# Patient Record
Sex: Female | Born: 1935 | Race: White | Hispanic: No | Smoking: Never smoker
Health system: Southern US, Community
[De-identification: ages and names within clinical notes are randomized; demographics above are authoritative.]

## PROBLEM LIST (undated history)

## (undated) DIAGNOSIS — M4850XA Collapsed vertebra, not elsewhere classified, site unspecified, initial encounter for fracture: Secondary | ICD-10-CM

## (undated) DIAGNOSIS — G8929 Other chronic pain: Secondary | ICD-10-CM

## (undated) DIAGNOSIS — I10 Essential (primary) hypertension: Secondary | ICD-10-CM

## (undated) DIAGNOSIS — M549 Dorsalgia, unspecified: Secondary | ICD-10-CM

## (undated) DIAGNOSIS — I4891 Unspecified atrial fibrillation: Secondary | ICD-10-CM

## (undated) DIAGNOSIS — I341 Nonrheumatic mitral (valve) prolapse: Secondary | ICD-10-CM

## (undated) DIAGNOSIS — I2510 Atherosclerotic heart disease of native coronary artery without angina pectoris: Secondary | ICD-10-CM

## (undated) DIAGNOSIS — E871 Hypo-osmolality and hyponatremia: Secondary | ICD-10-CM

## (undated) HISTORY — DX: Dorsalgia, unspecified: M54.9

## (undated) HISTORY — DX: Collapsed vertebra, not elsewhere classified, site unspecified, initial encounter for fracture: M48.50XA

## (undated) HISTORY — DX: Other chronic pain: G89.29

## (undated) HISTORY — DX: Hypo-osmolality and hyponatremia: E87.1

## (undated) HISTORY — DX: Unspecified atrial fibrillation: I48.91

---

## 2001-05-14 ENCOUNTER — Emergency Department (HOSPITAL_COMMUNITY): Admission: EM | Admit: 2001-05-14 | Discharge: 2001-05-14 | Payer: Self-pay

## 2001-05-14 ENCOUNTER — Encounter: Payer: Self-pay | Admitting: Emergency Medicine

## 2003-11-11 ENCOUNTER — Emergency Department (HOSPITAL_COMMUNITY): Admission: EM | Admit: 2003-11-11 | Discharge: 2003-11-11 | Payer: Self-pay | Admitting: *Deleted

## 2010-11-02 ENCOUNTER — Inpatient Hospital Stay (INDEPENDENT_AMBULATORY_CARE_PROVIDER_SITE_OTHER)
Admission: RE | Admit: 2010-11-02 | Discharge: 2010-11-02 | Disposition: A | Discharge status: Home / Self Care | Payer: Medicare Other | Source: Ambulatory Visit | Attending: Emergency Medicine | Admitting: Emergency Medicine

## 2010-11-02 ENCOUNTER — Ambulatory Visit (INDEPENDENT_AMBULATORY_CARE_PROVIDER_SITE_OTHER): Payer: Medicare Other

## 2010-11-02 ENCOUNTER — Inpatient Hospital Stay (HOSPITAL_COMMUNITY)
Admission: EM | Admit: 2010-11-02 | Discharge: 2010-11-05 | DRG: 690 | Disposition: A | Discharge status: Home / Self Care | Payer: Medicare Other | Source: Ambulatory Visit | Attending: Internal Medicine | Admitting: Internal Medicine

## 2010-11-02 DIAGNOSIS — T502X5A Adverse effect of carbonic-anhydrase inhibitors, benzothiadiazides and other diuretics, initial encounter: Secondary | ICD-10-CM | POA: Diagnosis present

## 2010-11-02 DIAGNOSIS — N390 Urinary tract infection, site not specified: Principal | ICD-10-CM | POA: Diagnosis present

## 2010-11-02 DIAGNOSIS — I4891 Unspecified atrial fibrillation: Secondary | ICD-10-CM | POA: Diagnosis present

## 2010-11-02 DIAGNOSIS — E876 Hypokalemia: Secondary | ICD-10-CM | POA: Diagnosis present

## 2010-11-02 DIAGNOSIS — E871 Hypo-osmolality and hyponatremia: Secondary | ICD-10-CM | POA: Diagnosis present

## 2010-11-02 DIAGNOSIS — I10 Essential (primary) hypertension: Secondary | ICD-10-CM | POA: Diagnosis present

## 2010-11-02 DIAGNOSIS — Y92009 Unspecified place in unspecified non-institutional (private) residence as the place of occurrence of the external cause: Secondary | ICD-10-CM

## 2010-11-02 DIAGNOSIS — I959 Hypotension, unspecified: Secondary | ICD-10-CM | POA: Diagnosis present

## 2010-11-02 DIAGNOSIS — Z888 Allergy status to other drugs, medicaments and biological substances status: Secondary | ICD-10-CM

## 2010-11-02 DIAGNOSIS — I252 Old myocardial infarction: Secondary | ICD-10-CM

## 2010-11-02 DIAGNOSIS — R05 Cough: Secondary | ICD-10-CM

## 2010-11-02 DIAGNOSIS — Z7982 Long term (current) use of aspirin: Secondary | ICD-10-CM

## 2010-11-02 DIAGNOSIS — R9431 Abnormal electrocardiogram [ECG] [EKG]: Secondary | ICD-10-CM | POA: Diagnosis present

## 2010-11-02 DIAGNOSIS — Z79899 Other long term (current) drug therapy: Secondary | ICD-10-CM

## 2010-11-02 DIAGNOSIS — I2510 Atherosclerotic heart disease of native coronary artery without angina pectoris: Secondary | ICD-10-CM | POA: Diagnosis present

## 2010-11-03 DIAGNOSIS — I359 Nonrheumatic aortic valve disorder, unspecified: Secondary | ICD-10-CM

## 2010-11-03 DIAGNOSIS — R9431 Abnormal electrocardiogram [ECG] [EKG]: Secondary | ICD-10-CM

## 2010-11-03 LAB — BASIC METABOLIC PANEL
BUN: 29 mg/dL — ABNORMAL HIGH (ref 6–23)
BUN: 25 mg/dL — ABNORMAL HIGH (ref 6–23)
CO2: 29 mEq/L (ref 19–32)
Calcium: 8.9 mg/dL (ref 8.4–10.5)
Calcium: 8.9 mg/dL (ref 8.4–10.5)
Calcium: 8.4 mg/dL (ref 8.4–10.5)
Chloride: 85 mEq/L — ABNORMAL LOW (ref 96–112)
Creatinine, Ser: 0.86 mg/dL (ref 0.50–1.10)
GFR calc Af Amer: >60 mL/min (ref >60)
GFR calc Af Amer: >60 mL/min (ref >60)
GFR calc non Af Amer: >60 mL/min (ref >60)
GFR calc non Af Amer: >60 mL/min (ref >60)
GFR calc non Af Amer: >60 mL/min (ref >60)
Glucose, Bld: 121 mg/dL — ABNORMAL HIGH (ref 70–99)
Glucose, Bld: 129 mg/dL — ABNORMAL HIGH (ref 70–99)
Glucose, Bld: 142 mg/dL — ABNORMAL HIGH (ref 70–99)
Potassium: 3.3 mEq/L — ABNORMAL LOW (ref 3.5–5.1)
Sodium: 123 mEq/L — ABNORMAL LOW (ref 135–145)
Sodium: 125 mEq/L — ABNORMAL LOW (ref 135–145)
Sodium: 126 mEq/L — ABNORMAL LOW (ref 135–145)

## 2010-11-03 LAB — CBC
HCT: 37.9 % (ref 36.0–46.0)
Hemoglobin: 13.9 g/dL (ref 12.0–15.0)
Hemoglobin: 13.3 g/dL (ref 12.0–15.0)
MCH: 31.5 pg (ref 26.0–34.0)
MCH: 30.6 pg (ref 26.0–34.0)
MCHC: 36.7 g/dL — ABNORMAL HIGH (ref 30.0–36.0)
MCHC: 35.5 g/dL (ref 30.0–36.0)
MCV: 85.9 fL (ref 78.0–100.0)
Platelets: 201 10*3/uL (ref 150–400)
Platelets: 194 10*3/uL (ref 150–400)
RBC: 4.41 MIL/uL (ref 3.87–5.11)
RDW: 12.4 % (ref 11.5–15.5)
RDW: 12.4 % (ref 11.5–15.5)
WBC: 7.4 10*3/uL (ref 4.0–10.5)

## 2010-11-03 LAB — URINALYSIS, ROUTINE W REFLEX MICROSCOPIC
Glucose, UA: NEGATIVE mg/dL
Specific Gravity, Urine: 1.017 (ref 1.005–1.030)
pH: 5.5 (ref 5.0–8.0)

## 2010-11-03 LAB — CK TOTAL AND CKMB (NOT AT ARMC)
CK, MB: 4.1 ng/mL — ABNORMAL HIGH (ref 0.3–4.0)
Relative Index: INVALID (ref 0.0–2.5)
Total CK: 82 U/L (ref 7–177)

## 2010-11-03 LAB — PROTIME-INR: INR: 1.17 (ref 0.00–1.49)

## 2010-11-03 LAB — CARDIAC PANEL(CRET KIN+CKTOT+MB+TROPI)
CK, MB: 4.3 ng/mL — ABNORMAL HIGH (ref 0.3–4.0)
CK, MB: 4.5 ng/mL — ABNORMAL HIGH (ref 0.3–4.0)
Total CK: 88 U/L (ref 7–177)

## 2010-11-03 LAB — DIFFERENTIAL
Basophils Absolute: 0 10*3/uL (ref 0.0–0.1)
Basophils Relative: 0 % (ref 0–1)
Eosinophils Absolute: 0.3 10*3/uL (ref 0.0–0.7)
Eosinophils Relative: 4 % (ref 0–5)
Lymphocytes Relative: 31 % (ref 12–46)
Lymphs Abs: 2.3 10*3/uL (ref 0.7–4.0)
Monocytes Absolute: 0.9 10*3/uL (ref 0.1–1.0)
Monocytes Relative: 13 % — ABNORMAL HIGH (ref 3–12)
Neutro Abs: 3.9 10*3/uL (ref 1.7–7.7)
Neutrophils Relative %: 52 % (ref 43–77)

## 2010-11-03 LAB — URINE MICROSCOPIC-ADD ON

## 2010-11-03 LAB — APTT: aPTT: 36 seconds (ref 24–37)

## 2010-11-04 LAB — URINE CULTURE

## 2010-11-04 LAB — CBC
MCH: 30.7 pg (ref 26.0–34.0)
MCV: 87 fL (ref 78.0–100.0)
Platelets: 195 10*3/uL (ref 150–400)
RDW: 12.6 % (ref 11.5–15.5)

## 2010-11-04 LAB — BASIC METABOLIC PANEL
Calcium: 8.7 mg/dL (ref 8.4–10.5)
Creatinine, Ser: 0.7 mg/dL (ref 0.50–1.10)
GFR calc Af Amer: >60 mL/min (ref >60)

## 2010-11-05 LAB — BASIC METABOLIC PANEL
CO2: 30 mEq/L (ref 19–32)
Calcium: 9 mg/dL (ref 8.4–10.5)
Creatinine, Ser: 0.82 mg/dL (ref 0.50–1.10)
Glucose, Bld: 84 mg/dL (ref 70–99)
Sodium: 135 mEq/L (ref 135–145)

## 2010-11-05 LAB — CBC
Hemoglobin: 12.4 g/dL (ref 12.0–15.0)
MCH: 30.8 pg (ref 26.0–34.0)
MCV: 88.3 fL (ref 78.0–100.0)
RBC: 4.03 MIL/uL (ref 3.87–5.11)

## 2010-11-06 NOTE — H&P (Signed)
NAMEGRACY, [name]               ACCOUNT NO.:  [id number]  MEDICAL RECORD NO.:  [id number]  LOCATION:  [facility]                         FACILITY:  [facility]  PHYSICIAN:  Osvaldo Shipper, MD     DATE OF BIRTH:  23-Mar-1917  DATE OF ADMISSION:  11/03/2010 DATE OF DISCHARGE:                             HISTORY & PHYSICAL   PRIMARY CARE PHYSICIAN:  Dr. Lilyan Punt, MD, [place], [place].  ADMISSION DIAGNOSES: 1. Urinary tract infection. 2. Severe hyponatremia. 3. Hypokalemia. 4. Mild dehydration. 5. Abnormal EKG. 6. History of atrial fibrillation in the past. 7. History of hypertension.  CHIEF COMPLAINT:  Low blood pressure.  HISTORY OF PRESENT ILLNESS:  The patient is a 77 year old Caucasian female who has a past medical history of mitral valve prolapse, remote history of AFib, not on anticoagulation, history of coronary artery disease, hypertension, who was in her usual state of health until earlier this week when she started having cold symptoms and she was started to have sinus infection, and she had some dysuria.  She called her doctor and she was prescribed amoxicillin.  She took these medications, did not feel much better, and she felt that her heart was racing at times and was slow at times.  She did not mention this to anybody earlier this week but then her son went and checked on her and he found that her blood pressure was fluctuating quite a bit going from 190 systolic to 80 systolic, heart rate was in the 140.  He called his sister, the patient daughter who went to spot and decided to get her here for evaluation.  Initially took the patient to Urgent Care but then brought her down to the emergency department because of the weight.  The patient denies any chest pain.  She admitted to some nausea, but no warm a day or diarrhea.  No fever.  No chills.  She had a normal bowel movement yesterday.  The patient received some Benadryl just a few minutes before I saw her and so  she is confused at this time.  She got the Benadryl because she had a local reaction to the ciprofloxacin infusion.  So history is somewhat limited because of the patient's inability to answer my questions currently.  Most of the history was provided by the patient's daughter.  MEDICATIONS AT HOME:  Unfortunately, the patient's daughter left the list of her home medicines at the [facility]gent [facility] Center where she thinks the patient is on the following amoxicillin recently prescribed aspirin, chlorthalidone, isosorbide dinitrate, magnesium oxide, Mucinex, and sotalol  ALLERGIES:  ATENOLOL, DOXYCYCLINE, LEXAPRO, PROPRANOLOL and now will include CIPROFLOXACIN.  PAST MEDICAL HISTORY:  Positive for history AFib in the past, but not on anticoagulation, she is just on aspirin.  History of hypertension.  She had an MI in 22 which was thought to be mild, did not require any cardiac catheterization or angioplasty, history of mitral valve prolapse, history of hypertension.  Denies any surgeries at all whatsoever.  SOCIAL HISTORY:  She lives in [place].  She lives by herself.  She usually is very independent.  She cleans and cokes herself.  No smoking, alcohol, or illicit drug use.  There  is no living will according to the daughter.  FAMILY HISTORY:  Positive for heart disease.  REVIEW OF SYSTEMS:  Unable to do at this time because of the patient's confusion from the Benadryl.  PHYSICAL EXAMINATION:  VITAL SIGNS:  Temperature 98.4, blood pressure here has been fluctuating to some extent, the lowest has been 98/48, the last recorded as 144/76, heart rate in the 70s, respiratory rate 18, and saturation is 96% on room air. GENERAL:  Exam.  Elderly female thin and no distress. HEENT:  Head is normocephalic and atraumatic.  Pupils are equal and reacting.  No pallor.  No icterus.  Oral mucous membranes slightly dry. No oral lesions are noted. NECK:  Soft and supple.  No thyromegaly appreciated.  No  cervical, supraclavicular, or inguinal lymphadenopathy present. LUNGS:  Clear to auscultation anteriorly bilaterally.  No obvious crackles are heard. CARDIOVASCULAR SYSTEM:  S1 and S2 normal, regular with a few premature beats.  No obvious murmur is heard. ABDOMEN:  Soft, nontender, nondistended.  Bowel sounds are present.  No mass or organomegaly appreciated. GU: Deferred. MUSCULOSKELETAL:  Normal muscle, mass, and tone. NEUROLOGIC:  She is alert, confused.  No focal neurologic deficits present. SKIN:  Does not reveal any rashes.  EKG was done at the Urgent Care Center which shows it appears to be sinus rhythm at 68 with normal axis intervals, appear to be in the normal range.  No definite Q-waves.  There are flipped T-waves in leads 1, 2, and 3.  There is ST depression in lead V4, V5, V6, also some in aVF.  This EKG was compared to an EKG from 2003 and the flipped T-waves are kind and new.  LABORATORY DATA:  Her CBC pretty unremarkable.  Her electrolytes show that sodium is 123, potassium 3.3, chloride is 85, bicarb is 29, glucose 121, BUN is 29, creatinine 0.86.  Cardiac enzymes negative x1.  Her UA shows turbid urine, moderate blood, positive nitrite, large leukocytes, numerous WBCs, many bacteria, 3-6 rbcs.  Chest x-ray was done which revealed findings suggestive of COPD with mild bibasilar opacities reflecting atelectasis of scarring.  Mild cardiomegaly is noted.  Likely chronic compression deformities of the lower thoracic and upper lumbar spine.  ASSESSMENT:  This is a 77 year old Caucasian female who was brought in for fluctuating vital signs at home and is found to have severe hyponatremia and a urinary tract infection.  PLAN: 1. Urinary tract infection.  We will check urine cultures.  We will     put her on ceftriaxone for now. 2. Hyponatremia.  This could be a result of her diuretics, although     she has been taking the chlorthalidone for over a year according  to     the daughter.  We will check urine osmolality and check urine     sodium.  We will give her gentle IV fluids, we will check BMET over     course of the day today. 3. Hypokalemia will be repleted. 4. Cough symptoms that she still has a cough so I will put her on     Zithromax as well. 5. History of AFib in the past.  Currently she is in sinus rhythm.  We     will give her sotalol with holding orders. 6. History of coronary artery disease is stable. 7. Abnormal EKG.  EKG will be repeated.  Cardiac enzymes will be     cycled.  An echocardiogram will be ordered to look for wall motion  abnormalities.  And then depending on these tests, further     evaluation may have to be done.  I discussed the code status with the patient's daughter.  She was extremely reluctant to discuss this with me, she did not want to have this conversation, so the patient remains a full code at this time.  DVT prophylaxis will be initiated.  Further management decisions will depend on results of further testing and patient's response to treatment.  Osvaldo Shipper, MD     GK/MEDQ  D:  11/03/2010  T:  11/03/2010  Job:  [id number]  Electronically Signed by Osvaldo Shipper MD on 11/06/2010 07:18:56 PM

## 2010-11-11 ENCOUNTER — Emergency Department (HOSPITAL_COMMUNITY): Payer: Medicare Other

## 2010-11-11 ENCOUNTER — Encounter (HOSPITAL_COMMUNITY): Payer: Self-pay | Admitting: Radiology

## 2010-11-11 ENCOUNTER — Inpatient Hospital Stay (HOSPITAL_COMMUNITY)
Admission: EM | Admit: 2010-11-11 | Discharge: 2010-11-17 | DRG: 542 | Disposition: A | Discharge status: Home Health | Payer: Medicare Other | Attending: Internal Medicine | Admitting: Internal Medicine

## 2010-11-11 DIAGNOSIS — Z7982 Long term (current) use of aspirin: Secondary | ICD-10-CM

## 2010-11-11 DIAGNOSIS — M810 Age-related osteoporosis without current pathological fracture: Secondary | ICD-10-CM | POA: Diagnosis present

## 2010-11-11 DIAGNOSIS — J189 Pneumonia, unspecified organism: Secondary | ICD-10-CM | POA: Diagnosis not present

## 2010-11-11 DIAGNOSIS — I4891 Unspecified atrial fibrillation: Secondary | ICD-10-CM | POA: Diagnosis present

## 2010-11-11 DIAGNOSIS — Z79899 Other long term (current) drug therapy: Secondary | ICD-10-CM

## 2010-11-11 DIAGNOSIS — I2510 Atherosclerotic heart disease of native coronary artery without angina pectoris: Secondary | ICD-10-CM | POA: Diagnosis present

## 2010-11-11 DIAGNOSIS — M8448XA Pathological fracture, other site, initial encounter for fracture: Principal | ICD-10-CM | POA: Diagnosis present

## 2010-11-11 DIAGNOSIS — Z87311 Personal history of (healed) other pathological fracture: Secondary | ICD-10-CM

## 2010-11-11 DIAGNOSIS — I10 Essential (primary) hypertension: Secondary | ICD-10-CM | POA: Diagnosis present

## 2010-11-11 DIAGNOSIS — E871 Hypo-osmolality and hyponatremia: Secondary | ICD-10-CM | POA: Diagnosis present

## 2010-11-11 DIAGNOSIS — I70219 Atherosclerosis of native arteries of extremities with intermittent claudication, unspecified extremity: Secondary | ICD-10-CM | POA: Diagnosis present

## 2010-11-11 DIAGNOSIS — I7100 Dissection of unspecified site of aorta: Secondary | ICD-10-CM | POA: Diagnosis present

## 2010-11-11 HISTORY — DX: Atherosclerotic heart disease of native coronary artery without angina pectoris: I25.10

## 2010-11-11 HISTORY — DX: Nonrheumatic mitral (valve) prolapse: I34.1

## 2010-11-11 HISTORY — DX: Essential (primary) hypertension: I10

## 2010-11-11 LAB — URINALYSIS, ROUTINE W REFLEX MICROSCOPIC
Bilirubin Urine: NEGATIVE
Glucose, UA: NEGATIVE mg/dL
Ketones, ur: NEGATIVE mg/dL
Leukocytes, UA: NEGATIVE
Protein, ur: NEGATIVE mg/dL

## 2010-11-11 LAB — COMPREHENSIVE METABOLIC PANEL
ALT: 22 U/L (ref 0–35)
CO2: 26 mEq/L (ref 19–32)
Calcium: 9.5 mg/dL (ref 8.4–10.5)
Creatinine, Ser: 0.96 mg/dL (ref 0.50–1.10)
GFR calc Af Amer: 59 mL/min — ABNORMAL LOW (ref >90)
GFR calc non Af Amer: 51 mL/min — ABNORMAL LOW (ref >90)
Glucose, Bld: 138 mg/dL — ABNORMAL HIGH (ref 70–99)
Total Bilirubin: 0.8 mg/dL (ref 0.3–1.2)

## 2010-11-11 LAB — DIFFERENTIAL
Basophils Relative: 1 % (ref 0–1)
Monocytes Absolute: 0.7 10*3/uL (ref 0.1–1.0)
Monocytes Relative: 7 % (ref 3–12)
Neutro Abs: 8.5 10*3/uL — ABNORMAL HIGH (ref 1.7–7.7)

## 2010-11-11 LAB — CBC
HCT: 39.1 % (ref 36.0–46.0)
Hemoglobin: 14.2 g/dL (ref 12.0–15.0)
MCH: 31.3 pg (ref 26.0–34.0)
MCHC: 36.3 g/dL — ABNORMAL HIGH (ref 30.0–36.0)
MCV: 86.3 fL (ref 78.0–100.0)

## 2010-11-11 LAB — LACTIC ACID, PLASMA: Lactic Acid, Venous: 1 mmol/L (ref 0.5–2.2)

## 2010-11-11 LAB — CK TOTAL AND CKMB (NOT AT ARMC): Relative Index: INVALID (ref 0.0–2.5)

## 2010-11-11 MED ORDER — IOHEXOL 350 MG/ML SOLN
80.0000 mL | Freq: Once | INTRAVENOUS | Status: AC | PRN
  Administered 2010-11-11: 80 mL via INTRAVENOUS

## 2010-11-12 ENCOUNTER — Inpatient Hospital Stay (HOSPITAL_COMMUNITY): Payer: Medicare Other

## 2010-11-12 LAB — BASIC METABOLIC PANEL
CO2: 23 mEq/L (ref 19–32)
Chloride: 84 mEq/L — ABNORMAL LOW (ref 96–112)
GFR calc Af Amer: 85 mL/min — ABNORMAL LOW (ref >90)
Potassium: 4 mEq/L (ref 3.5–5.1)
Sodium: 117 mEq/L — CL (ref 135–145)

## 2010-11-12 LAB — CARDIAC PANEL(CRET KIN+CKTOT+MB+TROPI)
CK, MB: 5.1 ng/mL — ABNORMAL HIGH (ref 0.3–4.0)
Relative Index: INVALID (ref 0.0–2.5)
Total CK: 82 U/L (ref 7–177)
Troponin I: <0.3 ng/mL (ref <0.30)
Troponin I: <0.3 ng/mL (ref <0.30)

## 2010-11-12 LAB — OSMOLALITY: Osmolality: 252 mOsm/kg — ABNORMAL LOW (ref 275–300)

## 2010-11-12 MED ORDER — GADOBENATE DIMEGLUMINE 529 MG/ML IV SOLN
10.0000 mL | Freq: Once | INTRAVENOUS | Status: AC
  Administered 2010-11-12: 10 mL via INTRAVENOUS

## 2010-11-12 NOTE — Discharge Summary (Signed)
NAMEKIMONI, [name]               ACCOUNT NO.:  [id number]  MEDICAL RECORD NO.:  [id number]  LOCATION:  4728                         FACILITY:  [facility]  PHYSICIAN:  Peggye Pitt, M.D. DATE OF BIRTH:  [birth year]  DATE OF ADMISSION:  11/02/2010 DATE OF DISCHARGE:  11/05/2010                              DISCHARGE SUMMARY   PRIMARY CARE PHYSICIAN:  Dr. Coral Spikes in [place], [place].  DISCHARGE DIAGNOSES: 1. Urinary tract infection. 2. Hyponatremia. 3. Hypotension. 4. Abnormal EKG. 5. History of atrial fibrillation. 6. History of hypertension. 7. Dehydration. 8. Hypokalemia, repleted.  DISCHARGE MEDICATIONS: 1. Bactrim double strength 1 tablet twice daily for 6 days. 2. Aspirin 81 mg daily. 3. Imdur 30 mg of which she takes 1 tablet in the morning and half a     tablet at night. 4. Magnesium oxide 400 mg twice daily. 5. Nitroglycerin 0.4 mg every 5 minutes up to three times as needed     for chest pain. 6. Sotalol 80 mg of which she takes half a tablet twice daily.  She has been instructed to discontinue use of chlorthalidone.  DISPOSITION AND FOLLOWUP:  [name] will be discharged home today in stable and improved condition.  She will go and live with her daughter for the next couple weeks until she recovers from this acute hospitalization.  She will need to follow up with her primary care physician in 1-2 weeks.  Her blood pressure should be closely monitored as well as her electrolytes.  CONSULTATION THIS HOSPITALIZATION:  Luis Abed, MD, [facility] with [facility].  IMAGES AND PROCEDURES: 1. A chest x-ray on November 02, 2010, that showed findings     suggestive of COPD with mild bibasilar opacities, mild     cardiomegaly, and likely chronic deformities at the lower thoracic     and upper lumbar spine. 2. A 2-D echocardiogram on November 03, 2010, that showed an ejection     fraction of 55% to 60% with normal wall motion and mild left  ventricular hypertrophy.  HISTORY AND PHYSICAL:  For full details, please refer to dictation on November 03, 2010, by Dr. Rito Ehrlich; however, in brief [name] is a 77- year-old very pleasant white woman who came into the hospital after being sent by her primary care physician for low blood pressure.  She had been having cold-like symptoms and had gone to see her primary care physician at which time she was noted to have a low blood pressure and was subsequently sent to the hospital for evaluation.  HOSPITAL COURSE BY PROBLEM: 1. Hypotension and hyponatremia.  On admission, she had a sodium level     of 123.  It is believed at this point that chlorthalidone was     probably the culprit for both hyponatremia and hypotension.  She     has been taken off chlorthalidone.  She has been given IV fluids.     At the time of discharge, her sodium is normal at 135.  She is no     longer hypotensive. 2. Urinary tract infection.  Her initial UA was very dirty and was     positive for nitrites with many bacteria  and WBCs too numerous to count, however, her subsequent culture has come back negative.  I     wonder this if is secondary to possibly partially treated UTI given     her recent use of antibiotics.  I will elect to treat her with     Bactrim for a total of 7 days of which she has 6 days pending at     the time of discharge. 3. EKG changes.  On admission EKG, she was noted to have ST depression     and T-wave inversions of the lateral leads.  Cardiac enzymes have     been negative.  She has not been complaining of chest pain.  A 2-D     echocardiogram shows good ejection fraction and no wall motion     abnormalities.  However, Cardiology was consulted who believe that     these changes were likely old.  Although the ST depressions have     improved on subsequent x-rays they still remain present. Rest of chronic conditions are stable.  VITALS ON DAY OF DISCHARGE:  Blood pressure 111/58,  heart rate 54, respirations 16, sats are 93% on room air, temperature of 97.5.     Peggye Pitt, M.D.     EH/MEDQ  D:  11/05/2010  T:  11/05/2010  Job:  [id number]  cc:   Christell Constant, MD, [facility]  Electronically Signed by Peggye Pitt M.D. on 11/12/2010 08:03:20 AM

## 2010-11-13 LAB — BASIC METABOLIC PANEL
CO2: 26 mEq/L (ref 19–32)
Chloride: 93 mEq/L — ABNORMAL LOW (ref 96–112)
Glucose, Bld: 83 mg/dL (ref 70–99)
Potassium: 4.2 mEq/L (ref 3.5–5.1)
Sodium: 127 mEq/L — ABNORMAL LOW (ref 135–145)

## 2010-11-14 LAB — BASIC METABOLIC PANEL
CO2: 27 mEq/L (ref 19–32)
Calcium: 9.4 mg/dL (ref 8.4–10.5)
Chloride: 96 mEq/L (ref 96–112)
Creatinine, Ser: 0.71 mg/dL (ref 0.50–1.10)
Glucose, Bld: 84 mg/dL (ref 70–99)

## 2010-11-14 LAB — CBC
Hemoglobin: 12.7 g/dL (ref 12.0–15.0)
MCH: 30.8 pg (ref 26.0–34.0)
MCV: 87.9 fL (ref 78.0–100.0)
Platelets: 279 10*3/uL (ref 150–400)
RBC: 4.12 MIL/uL (ref 3.87–5.11)
WBC: 6.8 10*3/uL (ref 4.0–10.5)

## 2010-11-15 LAB — MAGNESIUM: Magnesium: 1.7 mg/dL (ref 1.5–2.5)

## 2010-11-15 LAB — BASIC METABOLIC PANEL
Chloride: 98 mEq/L (ref 96–112)
Creatinine, Ser: 0.69 mg/dL (ref 0.50–1.10)
GFR calc Af Amer: 86 mL/min — ABNORMAL LOW (ref >90)
Sodium: 134 mEq/L — ABNORMAL LOW (ref 135–145)

## 2010-11-16 ENCOUNTER — Inpatient Hospital Stay (HOSPITAL_COMMUNITY): Payer: Medicare Other

## 2010-11-16 LAB — BASIC METABOLIC PANEL
BUN: 15 mg/dL (ref 6–23)
Creatinine, Ser: 0.76 mg/dL (ref 0.50–1.10)
GFR calc non Af Amer: 72 mL/min — ABNORMAL LOW (ref >90)
Glucose, Bld: 89 mg/dL (ref 70–99)
Potassium: 4.9 mEq/L (ref 3.5–5.1)

## 2010-11-17 ENCOUNTER — Inpatient Hospital Stay (HOSPITAL_COMMUNITY): Payer: Medicare Other

## 2010-11-17 LAB — BASIC METABOLIC PANEL
BUN: 19 mg/dL (ref 6–23)
CO2: 33 mEq/L — ABNORMAL HIGH (ref 19–32)
Chloride: 92 mEq/L — ABNORMAL LOW (ref 96–112)
Creatinine, Ser: 0.84 mg/dL (ref 0.50–1.10)
GFR calc Af Amer: 69 mL/min — ABNORMAL LOW (ref >90)

## 2010-11-17 LAB — EXPECTORATED SPUTUM ASSESSMENT W GRAM STAIN, RFLX TO RESP C

## 2010-11-17 NOTE — Discharge Summary (Signed)
[name], Raven               ACCOUNT NO.:  [id number]  MEDICAL RECORD NO.:  [id number]  LOCATION:  5506                         FACILITY:  [facility]  PHYSICIAN:  Conley Canal, MD      DATE OF BIRTH:  17-Nov-1917  DATE OF ADMISSION:  11/11/2010 DATE OF DISCHARGE:                        DISCHARGE SUMMARY - REFERRING   DISCHARGE DIAGNOSES: 1. Acute vertebral compression fractures involving L4 and T11 vertebra     resulting in unbearable back pain. 2. Hyponatremia, probably related to dehydration and pain. 3. Left lower lobe community-acquired pneumonia. 4. Atrial fibrillation, on sotalol, rate controlled, not on Coumadin     due to fall risk. 5. Accelerated hypertension. 6. Osteoporosis with multiple vertebral compression fractures, which     are chronic.  DISCHARGE MEDICATIONS: 1. Tylenol 650 mg every 6 hours as needed. 2. Albuterol inhaler 2 puffs every 4 hours as needed. 3. Amlodipine 10 mg daily. 4. Augmentin 875 mg twice daily for 7 days. 5. Colace 100 mg twice daily. 6. Mucinex 1200 mg twice daily. 7. Alendronate 70 mg by mouth weekly. 8. Calcium carbonate 650 mg 3 times daily with meals. 9. Aspirin 81 mg daily. 10.Imdur 30 mg in the morning, 15 mg in the evening. 11.Magnesium oxide 400 mg twice daily. 12.Nitroglycerin 0.4 mg sublingually as needed. 13.Sotalol 40 mg twice daily.  HOSPITAL COURSE:  Ms. Chen is a pleasant 77 year old female, who was in the hospital and was discharged on November 05, 2010, after treatment for urinary tract infection and severe hyponatremia.  She went home, but came back with complaints of severe back pain, not preceded by trauma. The patient was found to have acute vertebral compression fractures involving L4 and T11 in addition to other chronic vertebral compression fractures.  This was confirmed on MRI done on November 12, 2010.  During the hospital stay, the patient also complained of cough resulting in a repeat chest x-ray on  November 16, 2010, which showed interval worsening of retrocardiac ventilation, which could reflect lower lobe pneumonia or atelectasis associated to small bilateral pleural effusions.  The patient had been discharged on Bactrim in the last admission and she was placed on Augmentin in this admission, especially in view of allergies to quinolones.  Regarding the vertebrals compression fractures, initially the decision was to arrange for interventional radiology guided vertebroplasty, which the patient was offered, but turned down. Eventually, I discussed with Dr. Marikay Alar, who reviewed the images and felt that the patient would not be an ideal candidate for vertebroplasty given the osteoporosis and advanced age.  His recommendation was for activity as tolerated and for treatment of osteoporosis.  The patient will be discharged on alendronate as well as calcium carbonate.  She was given a back brace to use as needed. Otherwise, she was noted to have uncontrolled hypertension for which she was started on Norvasc with improvement in her blood pressure.  She is tolerating some activity and she would rather be at home with her supportive daughters.  Her primary care provider is Dr. Coral Spikes in [place], [place].  She will be looking for a local primary care provider.  Today, her labs are stable including a sodium of 130, BUN  19, creatinine is 0.84.  CBC normal.  She is discharged in stable condition.  The time spent for this discharge preparation is less than 30 minutes.     Conley Canal, MD     SR/MEDQ  D:  11/17/2010  T:  11/17/2010  Job:  [id number]  Electronically Signed by Conley Canal  on 11/17/2010 06:09:50 PM

## 2010-11-28 NOTE — Consult Note (Signed)
NAMEJERRILYN, [name]               ACCOUNT NO.:  [id number]  MEDICAL RECORD NO.:  [id number]  LOCATION:  4728                         FACILITY[facility]  [facility]  PHYSICIAN:  Luis Abed, MD, FACCDATE OF BIRTH:  24-Sep-1917  DATE OF CONSULTATION: DATE OF DISCHARGE:                                CONSULTATION   PRIMARY CARE PHYSICIAN:  Dr. Coral Spikes in [place], [place].  CARDIOLOGIST:  None.  CHIEF COMPLAINT:  Abnormal EKG.  HISTORY OF PRESENT ILLNESS:  Ms. Denison is a 77 year old female with a history of coronary artery disease and atrial fibrillation.  She began having cold symptoms about a week ago.  She also had some dysuria. Amoxicillin was called in, but she did not improve.  She developed tachy palpitations and her son found that her blood pressure was fluctuating between a systolic of 190 and a systolic of 80.  Her heart rate was in the 140s.  She was taken to Urgent Care and then to the emergency room. In the emergency room, she was found to have a UTI and severe hyponatremia as well as hypokalemia and dehydration.  She did not have any significant arrhythmia, but her EKG was abnormal and Cardiology was asked to evaluate her.  Ms. Devaul denies any chest pain or pressure.  She is currently not having any palpitations.  She is not short of breath.  She has chronic dyspnea on exertion which is unchanged.  She has not had any dizziness or presyncope.  She is still coughing and wheezing, but the cough is nonproductive.  The general malaise and UTI symptoms have improved since being admitted.  PAST MEDICAL HISTORY: 1. History of paroxysmal atrial fibrillation on sotalol. 2. Hypertension. 3. History of mitral valve prolapse. 4. History of MI in 1989, medical therapy. 5. Anticoagulation with aspirin only.  SURGICAL HISTORY:  None.  ALLERGIES:  She is allergic or intolerant to CIPRO, DOXYCYCLINE, MOEXIPRIL, INDERAL, ATENOLOL and PROTONIX.  CURRENT MEDICATIONS: 1.  Aspirin 81 mg a day. 2. Zithromax 500 mg IV daily. 3. Rocephin 1 g IV daily. 4. __________ p.r.n. 5. Colace 100 mg b.i.d. 6. Normal saline with 20 of K as well as IV potassium 20 mEq.  SOCIAL HISTORY:  She lives in [place] alone.  She is a widow.  She is in town visiting children.  She is retired.  She has no history of alcohol, tobacco or drug abuse.  FAMILY HISTORY:  Her mother died at 66 with a history of coronary artery disease.  She has a sister with atrial fibrillation and her father died of cancer.  REVIEW OF SYSTEMS:  She has had chills.  She has had coughing and wheezing.  Her dyspnea on exertion is chronic.  She has had some nausea as well as weakness.  She feels depression occasionally.  She had some urinary frequency, urgency and dysuria, but this has improved on medical therapy.  She denies reflux symptoms or melena.  Full 14-point review of systems are otherwise negative except as stated in the HPI.  PHYSICAL EXAMINATION:  VITAL SIGNS:  Temperature is 97.6, blood pressure 109/65, heart rate 66, respiratory rate 20 and O2 saturation 94% on room air. GENERAL:  She is a pleasant elderly white female, in no acute distress. HEENT:  Normal for age. NECK:  There is no lymphadenopathy, thyromegaly, bruit or JVD noted. CVA:  Her heart is regular in rate and rhythm with an S1-S2 and no significant murmur, rub or gallop is noted.  Pulses are decreased, but palpable.  Radial pulses are 2+ bilaterally.  LUNGS:  She has a few rhonchi with an expiratory wheeze noted bilaterally. SKIN:  No rashes or lesions are noted. ABDOMEN:  Soft and nontender with active bowel sounds. EXTREMITIES:  There is no cyanosis, clubbing or edema noted. MUSCULOSKELETAL:  There is no joint deformity or effusion and no spine or CVA tenderness. NEURO:  She is alert and oriented with cranial nerves II through XII grossly intact.  Chest x-ray shows COPD with mild bibasilar opacities that may  reflect atelectasis or scarring.  She has mild cardiomegaly.  EKG:  Sinus rhythm rate 60 with ST inversions in the lateral leads that are slightly improved on a repeat EKG this a.m.  No old is available for comparison.  LABORATORY VALUES:  Hemoglobin 13.3, hematocrit 37.5, WBC is 6.5 and platelets 194.  Sodium 125, potassium 2.9, chloride 88, CO2 29, BUN 126, creatinine 0.8, glucose 129, CK-MB #182/4.1, then 80/4.3 with a troponin I of less than 0.30.  Urine osmolality 241.  Urinalysis showing many bacteria, large leukocytes and positive nitrites with the urine culture pending.  IMPRESSION:  Ms. Vader was seen today by Dr. Myrtis Ser, the patient evaluated and the data reviewed.  She has cardiac history, but we do not have records.  She is not currently having cardiac symptoms.  We suspect her EKG changes are old.  SUGGESTIONS: 1. Treat her UTI as you are. 2. Obtained records from Dr. Fanny Dance office in [place]. 3. Aggressively treat hyponatremia and hypokalemia. 4. Check magnesium.  Unless she has ischemic symptoms or significant elevation in her cardiac enzymes, no further cardiac workup is indicated, but we will follow up on her echocardiogram.     Theodore Demark, PA-C   ______________________________ Luis Abed, MD, [facility]    RB/MEDQ  D:  11/03/2010  T:  11/03/2010  Job:  [id number]  Electronically Signed by Theodore Demark PA-C on 11/27/2010 07:56:50 PM Electronically Signed by Willa Rough MD FACC on 11/28/2010 12:20:28 PM

## 2010-12-08 NOTE — H&P (Signed)
[name], MURCHISON               ACCOUNT NO.:  [id number]  MEDICAL RECORD NO.:  [id number]  LOCATION:  [facility]                         FACILITY:  [facility]  PHYSICIAN:  Richarda Overlie, MD       DATE OF BIRTH:  [birth year]  DATE OF ADMISSION:  11/11/2010 DATE OF DISCHARGE:                             HISTORY & PHYSICAL   PRIMARY CARE PHYSICIAN:  Coral Spikes, MD, [place], [place].  PRIMARY COMPLAINT:  Back pain.  SUBJECTIVE:  This is a 77 year old female with a history of recent urinary tract infection, hyponatremia, admitted and discharged on October 19, 2002 who presents with an unrelated problem namely back pain.  The patient states that the pain in her back has progressively been getting worse.  She has numbness and tingling in bilateral lower extremities so much so that she has difficulty walking.  She states that she does have a history of peripheral vascular disease and has been told that she has claudication.  She denies any chest pain or shortness of breath per se but has had wheezing for about a week.  She feel that this is from a chest cold.  She has not had any fevers, chills, or rigors. She denies any urinary urgency, frequency, or dysuria.  She has completed the course of her Bactrim.  The patient denies any loss of bowel or bladder control.  She denies any history of trauma or falls. She denies any lightheadedness, syncope, and near syncopal episodes.  PAST MEDICAL HISTORY: 1. History of atrial fibrillation, not on anticoagulation. 2. Hypertension. 3. Coronary artery disease with MI in 1989. 4. History of mitral valve prolapse.  PAST SURGICAL HISTORY:  Denies surgeries.  ALLERGIES: 1. ATENOLOL. 2. DOXYCYCLINE. 3. LEXAPRO. 4. PROPRANOLOL. 5. CIPROFLOXACIN. 6. PROTONIX.  HOME MEDICATIONS:  Aspirin, Imdur, Mag-Ox, nitroglycerin, sotalol, and Bactrim.  SOCIAL HISTORY:  Lifetime nonsmoker and nonalcoholic.  PHYSICAL EXAMINATION:  VITAL SIGNS:  Blood  pressure 182/64, pulse of 58, respirations 20, and temperature 97.8. GENERAL:  Currently comfortable, wheezing, but in no acute cardiopulmonary distress. HEENT:  Pupils equal and reactive.  Extraocular movements intact.  Oral mucosa is dry. NECK:  Supple.  No JVD.  No thyromegaly. LUNGS:  Clear to auscultation bilaterally except for wheezing in both the bases. CARDIOVASCULAR:  Regular rate and rhythm.  No murmurs, rubs, or gallops. ABDOMEN:  Soft, nontender, and nondistended without any organomegaly. NEUROLOGIC:  Cranial nerves II-XII grossly intact.  LABORATORY DATA:  CT angio of the chest and abdomen and pelvis shows moderate-to-large calcified noncalcified plaques versus chronic thrombus dissection within the proximal and mid abdominal aorta narrowing the lumen.  No evidence of acute dissection, aneurysm, large irregular calcified plaque within the midabdominal aorta.  C11, L1, L4, and L5 compression fractures.  L4 compression fracture, 3-mm bony retropulsion and may be acute to subacute.  The compression fractures appear to be remote.  Urinalysis negative.  Lactic acid 1.  CMP:  Sodium 117, potassium 4.5, chloride 82, bicarb 26, glucose 138, BUN 20, and creatinine 0.96.  Total bilirubin is 0.8, alk phos is 68, AST 24, ALT 22, total protein 7.0, calcium 9.5.  Lipase 42.  Troponin negative.  CBC:  WBC 10.8,  hemoglobin 14.2, hematocrit 39.1, and platelet count of 272.  ASSESSMENT: 1. Hyponatremia which is chronic but exacerbated by the use of     Bactrim. 2. Recent urinary tract infection status post treatment with Bactrim. 3. History of atrial fibrillation, currently in normal sinus rhythm     and not on any anticoagulation at this time. 4. Acute on chronic back pain, rule out lumbar spinal stenosis.  PLAN:  The patient will be admitted for several reasons, primarily her uncontrolled back pain, her hyponatremia as well as her active ongoing wheezing which may be related to  chest cold versus pneumonia versus CHF exacerbation.  The patient will be started on treatment with a nebulizer.  We will do a chest x-ray, PA and lateral to rule out pneumonia.  We will start her on DuoNeb.  We will check a pro-BNP.  The patient recently had a 2-D echo that showed an EF of 55-60% without any regional wall motion abnormalities but did have some moderate-to-severe pulmonary hypertension with a PA peak pressure of 63 mmHg.  The patient was recently hospitalized and could have an underlying PE, but she is not tachycardic or hypoxic.  We will rule her out for a DVT/PE.  We will check a D-dimer.  If elevated, we will do a CT angio of the chest to rule out a PE.  For hyponatremia, we will check a urine osmolality, serum osmolality.  I suspect that this is because of Bactrim.  The course of Bactrim has been completed.  We will place her on fluid restriction.  She could also have SIADH given her pulmonary process.  We will start her on IV fluids, gentle hydration with normal saline at 60 mL per hour.  For back pain, we will do an MRI of the lumbar spine to rule out lumbar spinal stenosis.  If the patient's back pain continues, we will see if any intervention is required.  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  Dr. Edilia Bo was consulted by Renne Crigler, PA in the ER.  Dr. Edilia Bo will see the patient today.  The patient is a full code.     Richarda Overlie, MD     NA/MEDQ  D:  11/11/2010  T:  11/11/2010  Job:  [id number]  Electronically Signed by Richarda Overlie MD on 12/08/2010 10:33:47 AM

## 2010-12-22 ENCOUNTER — Other Ambulatory Visit: Payer: Self-pay | Admitting: Nurse Practitioner

## 2010-12-22 ENCOUNTER — Ambulatory Visit
Admission: RE | Admit: 2010-12-22 | Discharge: 2010-12-22 | Disposition: A | Discharge status: Home / Self Care | Payer: Medicare Other | Source: Ambulatory Visit | Attending: Nurse Practitioner | Admitting: Nurse Practitioner

## 2010-12-22 DIAGNOSIS — J189 Pneumonia, unspecified organism: Secondary | ICD-10-CM

## 2011-04-23 ENCOUNTER — Encounter: Payer: Self-pay | Admitting: *Deleted

## 2011-04-24 ENCOUNTER — Ambulatory Visit (INDEPENDENT_AMBULATORY_CARE_PROVIDER_SITE_OTHER): Payer: Medicare Other | Admitting: Cardiology

## 2011-04-24 ENCOUNTER — Encounter: Payer: Self-pay | Admitting: Cardiology

## 2011-04-24 VITALS — BP 129/68 | HR 56 | Wt 112.0 lb

## 2011-04-24 DIAGNOSIS — I10 Essential (primary) hypertension: Secondary | ICD-10-CM

## 2011-04-24 DIAGNOSIS — I341 Nonrheumatic mitral (valve) prolapse: Secondary | ICD-10-CM

## 2011-04-24 DIAGNOSIS — I2510 Atherosclerotic heart disease of native coronary artery without angina pectoris: Secondary | ICD-10-CM

## 2011-04-24 DIAGNOSIS — R002 Palpitations: Secondary | ICD-10-CM

## 2011-04-24 DIAGNOSIS — I739 Peripheral vascular disease, unspecified: Secondary | ICD-10-CM | POA: Insufficient documentation

## 2011-04-24 DIAGNOSIS — I059 Rheumatic mitral valve disease, unspecified: Secondary | ICD-10-CM

## 2011-04-24 DIAGNOSIS — I4891 Unspecified atrial fibrillation: Secondary | ICD-10-CM

## 2011-04-24 NOTE — Progress Notes (Signed)
HPI: 78 year old female for followup of atrial fibrillation. [place] She had a myocardial infarction in 1989 by her report. She was treated medically. She also has a history of atrial fibrillation and is on sotalol. She also has a history of mitral valve prolapse. She recently developed pneumonia while in [place]. She was in the hospital in the fall but following discharge developed back pain with a vertebral problem. She has been staying with her daughter since then. Note echocardiogram in September of 2012 showed normal LV function, mild left atrial enlargement, mild mitral and aortic insufficiency. She recently saw her primary care physician and noted occasional "skips" and occasional fast heart rates with followup slow heart rates. She cannot define fast and slow as she did not check her pulse. She denies chest pain or syncope. Mild dyspnea on exertion.  Current Outpatient Prescriptions  Medication Sig Dispense Refill  . albuterol (PROVENTIL HFA;VENTOLIN HFA) 108 (90 BASE) MCG/ACT inhaler Inhale 2 puffs into the lungs every 6 (six) hours as needed.      Marland Kitchen amLODipine (NORVASC) 10 MG tablet Take 10 mg by mouth daily.      Marland Kitchen aspirin 81 MG tablet Take 81 mg by mouth daily.      . isosorbide dinitrate (ISORDIL) 30 MG tablet Take by mouth. 1 tablet (30 mg) every morning and 1/2 tablet (15 mg) every evening      . magnesium oxide (MAG-OX) 400 MG tablet Take 400 mg by mouth 2 (two) times daily.      Marland Kitchen omeprazole (PRILOSEC) 20 MG capsule Take 20 mg by mouth daily.      . sotalol (BETAPACE) 80 MG tablet Take by mouth. 1/2 table (40 mg) two times daily         Past Medical History  Diagnosis Date  . Hypertension   . CAD (coronary artery disease)   . Mitral valve prolapse   . Osteoporosis   . Compression fracture of vertebral column   . Atrial fibrillation     on sotalol, rate controlled  . Hyponatremia   . Accelerated hypertension   . Myocardial infarct,  old 1989    which was thought to be mild, did not require any cardiac catheterization or angioplasty      . Chronic back pain     No past surgical history on file.  History   Social History  . Marital Status: Unknown    Spouse Name: N/A    Number of Children: N/A  . Years of Education: N/A   Occupational History  . Not on file.   Social History Main Topics  . Smoking status: Never Smoker   . Smokeless tobacco: Not on file  . Alcohol Use: Not on file  . Drug Use: Not on file  . Sexually Active: Not on file   Other Topics Concern  . Not on file   Social History Narrative   PAST MEDICAL HISTORY:  Positive for history AFib in the past, but not on anticoagulation, she is just on aspirin.  History of hypertension.  She had an MI in 84 which was thought to be mild, did not require any cardiac catheterization or angioplasty, history of mitral valve prolapse, history of hypertension.  Denies any surgeries at all whatsoever.    SOCIAL HISTORY:  She lives in [place].  She lives by herself.  She usually is very independent.  She cleans and cokes herself.  No smoking, alcohol, or illicit drug use.  There  is no living will according to the daughter.    FAMILY HISTORY:  Positive for heart disease.    ROS: Some numbness and tingling in her lower extremities with ambulation but no fevers or chills, productive cough, hemoptysis, dysphasia, odynophagia, melena, hematochezia, dysuria, hematuria, rash, seizure activity, orthopnea, PND, pedal edema. Remaining systems are negative.  Physical Exam: Well-developed frail in no acute distress.  Skin is warm and dry.  HEENT is normal.  Neck is supple.  Chest is clear to auscultation with normal expansion.  Cardiovascular exam is regular rate and rhythm.  Abdominal exam nontender or distended. No masses palpated. Extremities show no edema. Diminished distal pulses neuro grossly intact  ECG sinus rhythm at a rate of 56. Axis normal. Nonspecific ST  changes.

## 2011-04-24 NOTE — Assessment & Plan Note (Signed)
Blood pressure controlled. Continue present medications. 

## 2011-04-24 NOTE — Assessment & Plan Note (Signed)
Patient has symptoms suggestive of claudication. However I would like to be conservative if possible. We'll continue aspirin. No invasive procedure planned. Patient also states she is not interested in aggressive testing or evaluation.

## 2011-04-24 NOTE — Assessment & Plan Note (Signed)
Presumed secondary to history of MI. Continue aspirin.

## 2011-04-24 NOTE — Assessment & Plan Note (Signed)
Not evident on most recent echo. 

## 2011-04-24 NOTE — Patient Instructions (Signed)
Your physician wants you to follow-up in: 6 MONTHS You will receive a reminder letter in the mail two months in advance. If you don't receive a letter, please call our office to schedule the follow-up appointment. 

## 2011-04-24 NOTE — Assessment & Plan Note (Signed)
Patient remains in sinus rhythm on examination and by electrocardiogram. Continue sotalol. Continue aspirin. Not a good Coumadin candidate as she is extremely frail and has a balance problem. She does have occasional palpitations. These do not appear to be significantly bothersome. Given her age and overall body habitus I would like to be conservative if possible without aggressive evaluation. Note no history of syncope.

## 2012-01-25 ENCOUNTER — Telehealth: Payer: Self-pay | Admitting: Cardiology

## 2012-01-25 NOTE — Telephone Encounter (Signed)
New Problem: ° ° ° °I called the patient and was unable to reach them. I left a message on their voicemail with my name, the reason I called, the name of their physician, and a number to call back to schedule their appointment. ° °

## 2012-03-14 ENCOUNTER — Encounter: Payer: Self-pay | Admitting: Cardiology

## 2012-03-14 ENCOUNTER — Ambulatory Visit (INDEPENDENT_AMBULATORY_CARE_PROVIDER_SITE_OTHER): Payer: Medicare Other | Admitting: Cardiology

## 2012-03-14 VITALS — BP 126/62 | HR 56 | Ht 61.0 in | Wt 115.0 lb

## 2012-03-14 DIAGNOSIS — R0600 Dyspnea, unspecified: Secondary | ICD-10-CM | POA: Insufficient documentation

## 2012-03-14 DIAGNOSIS — I059 Rheumatic mitral valve disease, unspecified: Secondary | ICD-10-CM

## 2012-03-14 DIAGNOSIS — I4891 Unspecified atrial fibrillation: Secondary | ICD-10-CM

## 2012-03-14 DIAGNOSIS — R0609 Other forms of dyspnea: Secondary | ICD-10-CM

## 2012-03-14 DIAGNOSIS — I2510 Atherosclerotic heart disease of native coronary artery without angina pectoris: Secondary | ICD-10-CM

## 2012-03-14 DIAGNOSIS — I10 Essential (primary) hypertension: Secondary | ICD-10-CM

## 2012-03-14 DIAGNOSIS — I341 Nonrheumatic mitral (valve) prolapse: Secondary | ICD-10-CM

## 2012-03-14 LAB — BASIC METABOLIC PANEL
CO2: 30 mEq/L (ref 19–32)
Chloride: 99 mEq/L (ref 96–112)
Sodium: 135 mEq/L (ref 135–145)

## 2012-03-14 NOTE — Progress Notes (Signed)
HPI: Pleasant female for followup of atrial fibrillation. [place] She had a myocardial infarction in 1989 by her report. She was treated medically. She also has a history of atrial fibrillation and is on sotalol. She also has a history of mitral valve prolapse. Echocardiogram in September of 2012 showed normal LV function, mild left atrial enlargement, mild mitral and aortic insufficiency. Patient last seen in March of 2013. Since then, she does note some dyspnea on exertion. There is no orthopnea or PND. Occasional mild pedal edema in the latter part of the day. No chest pain or syncope.    Current Outpatient Prescriptions  Medication Sig Dispense Refill  . albuterol (PROVENTIL HFA;VENTOLIN HFA) 108 (90 BASE) MCG/ACT inhaler Inhale 2 puffs into the lungs every 6 (six) hours as needed.      Marland Kitchen amLODipine (NORVASC) 10 MG tablet Take 10 mg by mouth daily.      Marland Kitchen aspirin 81 MG tablet Take 81 mg by mouth daily.      . isosorbide mononitrate (IMDUR) 30 MG 24 hr tablet Take one tablet by mouth every morning and one half tablet by mouth every evening      . magnesium oxide (MAG-OX) 400 MG tablet Take 400 mg by mouth 2 (two) times daily.      Marland Kitchen omeprazole (PRILOSEC) 20 MG capsule Take 20 mg by mouth daily.      . sotalol (BETAPACE) 80 MG tablet Take by mouth. 1/2 table (40 mg) two times daily         Past Medical History  Diagnosis Date  . Hypertension   . CAD (coronary artery disease)   . Mitral valve prolapse   . Osteoporosis   . Compression fracture of vertebral column   . Atrial fibrillation     on sotalol, rate controlled  . Hyponatremia   . Accelerated hypertension   . Myocardial infarct, old 1989    which was thought to be mild, did not require any cardiac catheterization or angioplasty      . Chronic back pain     No past surgical history on file.  History   Social History  . Marital Status: Unknown    Spouse Name: N/A    Number of  Children: N/A  . Years of Education: N/A   Occupational History  . Not on file.   Social History Main Topics  . Smoking status: Never Smoker   . Smokeless tobacco: Never Used  . Alcohol Use: No  . Drug Use: No  . Sexually Active: Not on file   Other Topics Concern  . Not on file   Social History Narrative   PAST MEDICAL HISTORY:  Positive for history AFib in the past, but not on anticoagulation, she is just on aspirin.  History of hypertension.  She had an MI in 73 which was thought to be mild, did not require any cardiac catheterization or angioplasty, history of mitral valve prolapse, history of hypertension.  Denies any surgeries at all whatsoever.    SOCIAL HISTORY:  She lives in [place].  She lives by herself.  She usually is very independent.  She cleans and cokes herself.  No smoking, alcohol, or illicit drug use.  There is no living will according to the daughter.    FAMILY HISTORY:  Positive for heart disease.    ROS: no fevers or chills, productive cough, hemoptysis, dysphasia, odynophagia, melena, hematochezia, dysuria, hematuria, rash, seizure activity, orthopnea, PND, pedal edema, claudication.  Remaining systems are negative.  Physical Exam: Well-developed frail in no acute distress.  Skin is warm and dry.  HEENT is normal.  Neck is supple.  Chest is clear to auscultation with normal expansion.  Cardiovascular exam is regular rate and rhythm.  Abdominal exam nontender or distended. No masses palpated. Extremities show no edema. neuro grossly intact  ECG sinus rhythm at a rate of 56. Normal axis. Nonspecific ST changes.

## 2012-03-14 NOTE — Assessment & Plan Note (Signed)
Patient remains in sinus rhythm. Continue sotalol and aspirin. Not a Coumadin candidate. Check renal function.

## 2012-03-14 NOTE — Assessment & Plan Note (Signed)
Blood pressure controlled. Continue present medications. 

## 2012-03-14 NOTE — Assessment & Plan Note (Signed)
Etiology unclear. Not volume overloaded on examination. Question cardiac related. Check echocardiogram for LV function and BNP.

## 2012-03-14 NOTE — Patient Instructions (Signed)
Your physician wants you to follow-up in: 6 MONTHS WITH DR CRENSHAW You will receive a reminder letter in the mail two months in advance. If you don't receive a letter, please call our office to schedule the follow-up appointment.   Your physician has requested that you have an echocardiogram. Echocardiography is a painless test that uses sound waves to create images of your heart. It provides your doctor with information about the size and shape of your heart and how well your heart's chambers and valves are working. This procedure takes approximately one hour. There are no restrictions for this procedure.   Your physician recommends that you HAVE LAB WORK TODAY 

## 2012-03-14 NOTE — Assessment & Plan Note (Signed)
Plan conservative measures if possible given age. Continue aspirin. No chest pain.

## 2012-03-14 NOTE — Assessment & Plan Note (Signed)
Plan repeat echocardiogram. 

## 2012-03-18 ENCOUNTER — Ambulatory Visit (HOSPITAL_COMMUNITY): Payer: Medicare Other | Attending: Cardiology | Admitting: Radiology

## 2012-03-18 DIAGNOSIS — I2510 Atherosclerotic heart disease of native coronary artery without angina pectoris: Secondary | ICD-10-CM | POA: Insufficient documentation

## 2012-03-18 DIAGNOSIS — I10 Essential (primary) hypertension: Secondary | ICD-10-CM | POA: Insufficient documentation

## 2012-03-18 DIAGNOSIS — I080 Rheumatic disorders of both mitral and aortic valves: Secondary | ICD-10-CM | POA: Insufficient documentation

## 2012-03-18 DIAGNOSIS — I079 Rheumatic tricuspid valve disease, unspecified: Secondary | ICD-10-CM | POA: Insufficient documentation

## 2012-03-18 DIAGNOSIS — R0989 Other specified symptoms and signs involving the circulatory and respiratory systems: Secondary | ICD-10-CM | POA: Insufficient documentation

## 2012-03-18 DIAGNOSIS — I252 Old myocardial infarction: Secondary | ICD-10-CM | POA: Insufficient documentation

## 2012-03-18 DIAGNOSIS — I341 Nonrheumatic mitral (valve) prolapse: Secondary | ICD-10-CM

## 2012-03-18 DIAGNOSIS — R0609 Other forms of dyspnea: Secondary | ICD-10-CM | POA: Insufficient documentation

## 2012-03-18 DIAGNOSIS — R0600 Dyspnea, unspecified: Secondary | ICD-10-CM

## 2012-03-18 DIAGNOSIS — I4891 Unspecified atrial fibrillation: Secondary | ICD-10-CM | POA: Insufficient documentation

## 2012-03-18 NOTE — Progress Notes (Signed)
Echocardiogram performed.  

## 2012-03-22 ENCOUNTER — Emergency Department (HOSPITAL_COMMUNITY)
Admission: EM | Admit: 2012-03-22 | Discharge: 2012-03-22 | Disposition: A | Discharge status: Home / Self Care | Payer: Medicare Other | Attending: Emergency Medicine | Admitting: Emergency Medicine

## 2012-03-22 ENCOUNTER — Other Ambulatory Visit: Payer: Self-pay

## 2012-03-22 ENCOUNTER — Emergency Department (HOSPITAL_COMMUNITY): Payer: Medicare Other

## 2012-03-22 ENCOUNTER — Encounter (HOSPITAL_COMMUNITY): Payer: Self-pay | Admitting: *Deleted

## 2012-03-22 DIAGNOSIS — Z8679 Personal history of other diseases of the circulatory system: Secondary | ICD-10-CM | POA: Insufficient documentation

## 2012-03-22 DIAGNOSIS — Z8639 Personal history of other endocrine, nutritional and metabolic disease: Secondary | ICD-10-CM | POA: Insufficient documentation

## 2012-03-22 DIAGNOSIS — Z79899 Other long term (current) drug therapy: Secondary | ICD-10-CM | POA: Insufficient documentation

## 2012-03-22 DIAGNOSIS — Z87312 Personal history of (healed) stress fracture: Secondary | ICD-10-CM | POA: Insufficient documentation

## 2012-03-22 DIAGNOSIS — I2510 Atherosclerotic heart disease of native coronary artery without angina pectoris: Secondary | ICD-10-CM | POA: Insufficient documentation

## 2012-03-22 DIAGNOSIS — I252 Old myocardial infarction: Secondary | ICD-10-CM | POA: Insufficient documentation

## 2012-03-22 DIAGNOSIS — M79609 Pain in unspecified limb: Secondary | ICD-10-CM | POA: Insufficient documentation

## 2012-03-22 DIAGNOSIS — M810 Age-related osteoporosis without current pathological fracture: Secondary | ICD-10-CM | POA: Insufficient documentation

## 2012-03-22 DIAGNOSIS — M79603 Pain in arm, unspecified: Secondary | ICD-10-CM

## 2012-03-22 DIAGNOSIS — Z7982 Long term (current) use of aspirin: Secondary | ICD-10-CM | POA: Insufficient documentation

## 2012-03-22 DIAGNOSIS — I10 Essential (primary) hypertension: Secondary | ICD-10-CM | POA: Insufficient documentation

## 2012-03-22 DIAGNOSIS — Z862 Personal history of diseases of the blood and blood-forming organs and certain disorders involving the immune mechanism: Secondary | ICD-10-CM | POA: Insufficient documentation

## 2012-03-22 LAB — CBC
HCT: 40.6 % (ref 36.0–46.0)
MCV: 91.9 fL (ref 78.0–100.0)
RBC: 4.42 MIL/uL (ref 3.87–5.11)
WBC: 5.9 10*3/uL (ref 4.0–10.5)

## 2012-03-22 LAB — COMPREHENSIVE METABOLIC PANEL
AST: 20 U/L (ref 0–37)
BUN: 18 mg/dL (ref 6–23)
CO2: 29 mEq/L (ref 19–32)
Chloride: 99 mEq/L (ref 96–112)
Creatinine, Ser: 0.91 mg/dL (ref 0.50–1.10)
GFR calc non Af Amer: 53 mL/min — ABNORMAL LOW (ref >90)
Total Bilirubin: 0.6 mg/dL (ref 0.3–1.2)

## 2012-03-22 LAB — POCT I-STAT TROPONIN I: Troponin i, poc: 0.01 ng/mL (ref 0.00–0.08)

## 2012-03-22 MED ORDER — ACETAMINOPHEN 325 MG PO TABS
650.0000 mg | ORAL_TABLET | Freq: Once | ORAL | Status: AC
  Administered 2012-03-22: 650 mg via ORAL
  Filled 2012-03-22: qty 2

## 2012-03-22 NOTE — ED Provider Notes (Signed)
History     CSN: [id number]  Arrival date & time 03/22/12  1033   First MD Initiated Contact with Patient 03/22/12 1241      Chief Complaint  Patient presents with  . Arm Pain    (Consider location/radiation/quality/duration/timing/severity/associated sxs/prior treatment) Patient is a 79 y.o. female presenting with arm pain. The history is provided by the patient.  Arm Pain Pertinent negatives include no chest pain, no abdominal pain, no headaches and no shortness of breath.  pt c/o pain localized to mid to upper left arm for the past year. States carried her pocketbook there for years.  Denies shoulder pain. No neck or radicular pain. Pain intermittent, at rest. No specific exacerbating or alleviating symptoms, x noted worse at times when pocketbook strap resting on area. No skin changes or erythema. Denies prior injury or fx to area. No relation to activity or exertion. No associated cp or discomfort. No sob, nv or diaphoresis. No unusual doe or fatigue.     Past Medical History  Diagnosis Date  . Hypertension   . CAD (coronary artery disease)   . Mitral valve prolapse   . Osteoporosis   . Compression fracture of vertebral column   . Atrial fibrillation     on sotalol, rate controlled  . Hyponatremia   . Accelerated hypertension   . Myocardial infarct, old 1989    which was thought to be mild, did not require any cardiac catheterization or angioplasty      . Chronic back pain     History reviewed. No pertinent past surgical history.  Family History  Problem Relation Age of Onset  . Heart disease      Family History : Positive for    History  Substance Use Topics  . Smoking status: Never Smoker   . Smokeless tobacco: Never Used  . Alcohol Use: No    OB History   Grav Para Term Preterm Abortions TAB SAB Ect Mult Living                  Review of Systems  Constitutional: Negative for fever and chills.  HENT: Negative for neck pain.   Eyes: Negative for  redness.  Respiratory: Negative for shortness of breath.   Cardiovascular: Negative for chest pain and leg swelling.  Gastrointestinal: Negative for abdominal pain.  Genitourinary: Negative for flank pain.  Musculoskeletal: Negative for back pain and joint swelling.  Skin: Negative for rash.  Neurological: Negative for weakness, numbness and headaches.  Hematological: Does not bruise/bleed easily.  Psychiatric/Behavioral: Negative for confusion.    Allergies  Atenolol; Ciprofloxacin; Doxycycline; Escitalopram oxalate; Moexipril; Pantoprazole sodium; and Propranolol  Home Medications   Current Outpatient Rx  Name  Route  Sig  Dispense  Refill  . albuterol (PROVENTIL HFA;VENTOLIN HFA) 108 (90 BASE) MCG/ACT inhaler   Inhalation   Inhale 2 puffs into the lungs every 6 (six) hours as needed.         Marland Kitchen amLODipine (NORVASC) 10 MG tablet   Oral   Take 10 mg by mouth daily.         Marland Kitchen aspirin 81 MG tablet   Oral   Take 81 mg by mouth daily.         . isosorbide mononitrate (IMDUR) 30 MG 24 hr tablet      Take one tablet by mouth every morning and one half tablet by mouth every evening         . magnesium oxide (MAG-OX) 400 MG  tablet   Oral   Take 400 mg by mouth 2 (two) times daily.         Marland Kitchen omeprazole (PRILOSEC) 20 MG capsule   Oral   Take 20 mg by mouth daily.         . sotalol (BETAPACE) 80 MG tablet   Oral   Take by mouth. 1/2 table (40 mg) two times daily           BP 151/59  Pulse 64  Temp(Src) 97.5 F (36.4 C) (Oral)  Resp 18  SpO2 95%  Physical Exam  Nursing note and vitals reviewed. Constitutional: She appears well-developed and well-nourished. No distress.  HENT:  Mouth/Throat: Oropharynx is clear and moist.  Eyes: Conjunctivae are normal. No scleral icterus.  Neck: Neck supple. No tracheal deviation present.  Cardiovascular: Normal rate, regular rhythm, normal heart sounds and intact distal pulses.   Pulmonary/Chest: Effort normal and  breath sounds normal. No respiratory distress.  Abdominal: Soft. Normal appearance. She exhibits no distension.  Musculoskeletal: She exhibits no edema.  Mild tenderness mid to upper left upper arm. Skin appears intact/normal. No rash/lesions. No pain w rom at elbow or shoulder. Spine nt. No sts noted. Distal pulses palp.   Neurological: She is alert.  Motor intact bil.   Skin: Skin is warm and dry. No rash noted.  Psychiatric: She has a normal mood and affect.    ED Course  Procedures (including critical care time)  Results for orders placed during the hospital encounter of 03/22/12  CBC      Result Value Range   WBC 5.9  4.0 - 10.5 K/uL   RBC 4.42  3.87 - 5.11 MIL/uL   Hemoglobin 14.0  12.0 - 15.0 g/dL   HCT 98.1  19.1 - 47.8 %   MCV 91.9  78.0 - 100.0 fL   MCH 31.7  26.0 - 34.0 pg   MCHC 34.5  30.0 - 36.0 g/dL   RDW 29.5  62.1 - 30.8 %   Platelets 250  150 - 400 K/uL  COMPREHENSIVE METABOLIC PANEL      Result Value Range   Sodium 137  135 - 145 mEq/L   Potassium 4.3  3.5 - 5.1 mEq/L   Chloride 99  96 - 112 mEq/L   CO2 29  19 - 32 mEq/L   Glucose, Bld 143 (*) 70 - 99 mg/dL   BUN 18  6 - 23 mg/dL   Creatinine, Ser 6.57  0.50 - 1.10 mg/dL   Calcium 9.6  8.4 - 84.6 mg/dL   Total Protein 7.2  6.0 - 8.3 g/dL   Albumin 3.6  3.5 - 5.2 g/dL   AST 20  0 - 37 U/L   ALT 9  0 - 35 U/L   Alkaline Phosphatase 68  39 - 117 U/L   Total Bilirubin 0.6  0.3 - 1.2 mg/dL   GFR calc non Af Amer 53 (*) >90 mL/min   GFR calc Af Amer 62 (*) >90 mL/min  POCT I-STAT TROPONIN I      Result Value Range   Troponin i, poc 0.01  0.00 - 0.08 ng/mL   Comment 3            Dg Humerus Left  03/22/2012  *RADIOLOGY REPORT*  Clinical Data: 79 year old female with left arm pain.  LEFT HUMERUS - 2+ VIEW  Comparison: None  Findings: There is no evidence of fracture, subluxation or dislocation. Mild degenerative changes of the shoulder are  noted. No focal bony lesions are present. No soft tissue abnormalities  are identified.  IMPRESSION: No evidence of acute abnormality.  Mild degenerative changes of the shoulder.   Original Report Authenticated By: Harmon Pier, M.D.        MDM  Xrays.  Labs sent from triage.   Tylenol po.  Reviewed nursing notes and prior charts for additional history.     Date: 03/22/2012  Rate: 62  Rhythm: normal sinus rhythm  QRS Axis: normal  Intervals: normal  ST/T Wave abnormalities: nonspecific ST/T changes  Conduction Disutrbances:none  Narrative Interpretation:   Old EKG Reviewed: unchanged  After constant/daily symptoms for prolonged period, trop negative.  Xray neg acute.   Pt appears stable for d/c w pcp follow up.         Suzi Roots, MD 03/22/12 [phone]

## 2012-03-22 NOTE — ED Notes (Signed)
Pt reports left arm pain for over a year from carrying her pocketbook, reports having a knot on her arm, but now pain has increased and remained constant, denies new injury to arm. Denies cp.

## 2012-03-25 ENCOUNTER — Encounter: Payer: Self-pay | Admitting: Cardiology

## 2012-04-01 ENCOUNTER — Encounter: Payer: Self-pay | Admitting: *Deleted

## 2012-05-01 ENCOUNTER — Telehealth: Payer: Self-pay | Admitting: Cardiology

## 2012-05-01 NOTE — Telephone Encounter (Signed)
Spoke with pt dtr, she reports the pt has had an irregular heart beat off and on for about one week now. Her heart rate is 58 and the pt states there have been no episodes of her heart beating fast. She does states her heart beats hard at times and occ she will feel it in her stomach. The pt denies SOB or chest pain. Reassurance given to dtr, as long as the heart rate is controlled she will be fine to see lori geehardt np tomorrow. dtr voiced understanding if the pt condition changes she can always take her to the ER. Follow up appt made.

## 2012-05-01 NOTE — Telephone Encounter (Signed)
New Prob   C/o irregular heartbeat, going on for about a week off and on. Would like to be seen today.

## 2012-05-01 NOTE — Telephone Encounter (Signed)
New problem    Would like to be seen today    C/O irregular heart rate.

## 2012-05-02 ENCOUNTER — Ambulatory Visit (INDEPENDENT_AMBULATORY_CARE_PROVIDER_SITE_OTHER): Payer: Medicaid Other | Admitting: Nurse Practitioner

## 2012-05-02 ENCOUNTER — Encounter: Payer: Self-pay | Admitting: Nurse Practitioner

## 2012-05-02 VITALS — BP 160/70 | HR 56 | Ht 61.0 in | Wt 118.4 lb

## 2012-05-02 DIAGNOSIS — R002 Palpitations: Secondary | ICD-10-CM

## 2012-05-02 NOTE — Patient Instructions (Addendum)
I think you are doing ok. I would not change your medicines at this time  You are still in a regular rhythm today  Try to cut back on the chocolate  Dr. Jens Som will see you in August as planned  Call the [facility] office at [phone] if you have any questions, problems or concerns.

## 2012-05-02 NOTE — Progress Notes (Signed)
Waldon Reining Date of Birth: 06-22-1917 Medical Record #[id number]  History of Present Illness: Ms. Simoni is seen back today for a work in visit. She is seen for Dr. Jens Som. She has known CAD with remote MI by her report in 1989 - managed medically. Has had PAF and is on Sotalol. Other issues include MVP and chronic DOE.   Last seen here last month. Was complaining of DOE. Echo updated. Looked ok except for diastolic dysfunction. Does have LAE on echo.   Comes in today. She is here with family. She has noted an irregular heart beat off and on for the past one week. Her rate has remained in the 50's. She was worried that she was back out of rhythm. Not lightheaded or dizzy. Has had more back pain from a fractured vertebra. No caffeine in beverages but admits she likes a lot of chocolate. Her symptoms have improved since the appointment was made. No chest pain. Still with DOE but seems to be unchanged.   Current Outpatient Prescriptions on File Prior to Visit  Medication Sig Dispense Refill  . albuterol (PROVENTIL HFA;VENTOLIN HFA) 108 (90 BASE) MCG/ACT inhaler Inhale 2 puffs into the lungs every 6 (six) hours as needed.      Marland Kitchen amLODipine (NORVASC) 10 MG tablet Take 10 mg by mouth daily.      Marland Kitchen aspirin EC 81 MG tablet Take 81 mg by mouth daily.      . isosorbide mononitrate (IMDUR) 30 MG 24 hr tablet Take 15-30 mg by mouth 2 (two) times daily. Take one tablet by mouth every morning and one half tablet by mouth every evening      . magnesium oxide (MAG-OX) 400 MG tablet Take 400 mg by mouth 2 (two) times daily.      Marland Kitchen omeprazole (PRILOSEC) 20 MG capsule Take 20 mg by mouth daily.      . sotalol (BETAPACE) 80 MG tablet Take 40 mg by mouth 2 (two) times daily. 1/2 table (40 mg) two times daily       No current facility-administered medications on file prior to visit.    Allergies  Allergen Reactions  . Atenolol   . Ciprofloxacin   . Doxycycline   . Escitalopram Oxalate   . Moexipril     . Pantoprazole Sodium   . Propranolol     Past Medical History  Diagnosis Date  . Hypertension   . CAD (coronary artery disease)   . Mitral valve prolapse   . Osteoporosis   . Compression fracture of vertebral column   . Atrial fibrillation     on sotalol, rate controlled  . Hyponatremia   . Accelerated hypertension   . Myocardial infarct, old 1989    which was thought to be mild, did not require any cardiac catheterization or angioplasty      . Chronic back pain     History reviewed. No pertinent past surgical history.  History  Smoking status  . Never Smoker   Smokeless tobacco  . Never Used    History  Alcohol Use No    Family History  Problem Relation Age of Onset  . Heart disease      Family History : Positive for    Review of Systems: The review of systems is per the HPI.  All other systems were reviewed and are negative.  Physical Exam: BP 160/70  Pulse 56  Ht 5\' 1"  (1.549 m)  Wt 118 lb 6.4 oz (53.706 kg)  BMI 22.38 kg/m2 Blood pressure by me is down to 140/80.  Patient is very pleasant and in no acute distress. She does appear frail. Skin is warm and dry. Color is normal.  HEENT is unremarkable. Normocephalic/atraumatic. PERRL. Sclera are nonicteric. Neck is supple. No masses. No JVD. Lungs are clear. Cardiac exam shows a regular rate and rhythm. She has occasional ectopic noted. Abdomen is soft. Extremities are without edema. Gait and ROM are intact. No gross neurologic deficits noted.  LABORATORY DATA: EKG today shows sinus bradycardia with PACs.  She is not in atrial fib.   Lab Results  Component Value Date   WBC 5.9 03/22/2012   HGB 14.0 03/22/2012   HCT 40.6 03/22/2012   PLT 250 03/22/2012   GLUCOSE 143* 03/22/2012   ALT 9 03/22/2012   AST 20 03/22/2012   NA 137 03/22/2012   K 4.3 03/22/2012   CL 99 03/22/2012   CREATININE 0.91 03/22/2012   BUN 18 03/22/2012   CO2 29 03/22/2012   TSH 0.909 11/11/2010   INR 1.17 11/03/2010   Echo Study Conclusions  from February 2014  - Left ventricle: The cavity size was normal. Wall thickness was increased in a pattern of mild LVH. Systolic function was normal. The estimated ejection fraction was in the range of 55% to 60%. Wall motion was normal; there were no regional wall motion abnormalities. Features are consistent with a pseudonormal left ventricular filling pattern, with concomitant abnormal relaxation and increased filling pressure (grade 2 diastolic dysfunction). - Aortic valve: Mild regurgitation. - Mitral valve: Mild regurgitation. - Left atrium: The atrium was moderately dilated. - Pulmonary arteries: PA peak pressure: 36mm Hg (S).   Assessment / Plan:  1. PAF - on sotalol - in sinus today. I suspect what she is feeling is the PAC's. I am hesitant to make changes in her Sotalol given her resting bradycardia. Would not want to make that worse and precipitate dizziness/passing out/fall. She is reassured. Will tentatively see her back at her regular time in August.   2. Advanced age  Patient is agreeable to this plan and will call if any problems develop in the interim.   Rosalio Macadamia, RN, ANP-C [facility] [address] [place], [place]  [address]

## 2012-05-15 ENCOUNTER — Telehealth: Payer: Self-pay | Admitting: Cardiology

## 2012-05-15 DIAGNOSIS — R002 Palpitations: Secondary | ICD-10-CM

## 2012-05-15 NOTE — Telephone Encounter (Signed)
Pt seen by lori gerhardt np recently for palpitations. She would like to get a monitor to find out what the irregular heart beats are. Will forward for order for event monitor from dr Jens Som

## 2012-05-15 NOTE — Telephone Encounter (Signed)
Left message for pts dtr, will place the order for a monitor and someone from our office will call to schedule appt for them to come to the office. They will call back if they need to talk with me regarding this.

## 2012-05-15 NOTE — Telephone Encounter (Signed)
New problem     C/o heart skipping beat . Feels like it's weak. Daughter is asking for heart monitor to be place.

## 2012-05-15 NOTE — Telephone Encounter (Signed)
Ok for event monitor [facility]

## 2012-05-20 ENCOUNTER — Telehealth: Payer: Self-pay | Admitting: Cardiology

## 2012-05-20 NOTE — Telephone Encounter (Signed)
New Prob   Calling in following up on a heart rate monitor request. Would like to speak to nurse.

## 2012-05-20 NOTE — Telephone Encounter (Signed)
Order placed 05-15-12, will forward to rose jacobs in the monitor room.

## 2012-05-22 ENCOUNTER — Ambulatory Visit (INDEPENDENT_AMBULATORY_CARE_PROVIDER_SITE_OTHER): Payer: Medicare Other

## 2012-05-22 DIAGNOSIS — R002 Palpitations: Secondary | ICD-10-CM

## 2012-05-22 NOTE — Progress Notes (Signed)
  [name] a 30 day event monitor on patient and went over instructions on how to use it and when to return it

## 2012-05-23 ENCOUNTER — Encounter (HOSPITAL_COMMUNITY): Payer: Self-pay | Admitting: Emergency Medicine

## 2012-05-23 ENCOUNTER — Inpatient Hospital Stay (HOSPITAL_COMMUNITY)
Admission: EM | Admit: 2012-05-23 | Discharge: 2012-05-24 | DRG: 556 | Disposition: A | Discharge status: Home / Self Care | Payer: Medicare Other | Attending: Cardiovascular Disease | Admitting: Cardiovascular Disease

## 2012-05-23 ENCOUNTER — Observation Stay (HOSPITAL_COMMUNITY): Payer: Medicare Other

## 2012-05-23 DIAGNOSIS — R0609 Other forms of dyspnea: Secondary | ICD-10-CM | POA: Diagnosis present

## 2012-05-23 DIAGNOSIS — Z79899 Other long term (current) drug therapy: Secondary | ICD-10-CM

## 2012-05-23 DIAGNOSIS — I2510 Atherosclerotic heart disease of native coronary artery without angina pectoris: Secondary | ICD-10-CM | POA: Diagnosis present

## 2012-05-23 DIAGNOSIS — I4891 Unspecified atrial fibrillation: Secondary | ICD-10-CM | POA: Diagnosis present

## 2012-05-23 DIAGNOSIS — M810 Age-related osteoporosis without current pathological fracture: Secondary | ICD-10-CM | POA: Diagnosis present

## 2012-05-23 DIAGNOSIS — E871 Hypo-osmolality and hyponatremia: Secondary | ICD-10-CM | POA: Diagnosis present

## 2012-05-23 DIAGNOSIS — M549 Dorsalgia, unspecified: Secondary | ICD-10-CM | POA: Diagnosis present

## 2012-05-23 DIAGNOSIS — I498 Other specified cardiac arrhythmias: Secondary | ICD-10-CM | POA: Diagnosis present

## 2012-05-23 DIAGNOSIS — I491 Atrial premature depolarization: Secondary | ICD-10-CM | POA: Diagnosis present

## 2012-05-23 DIAGNOSIS — R9431 Abnormal electrocardiogram [ECG] [EKG]: Secondary | ICD-10-CM

## 2012-05-23 DIAGNOSIS — I059 Rheumatic mitral valve disease, unspecified: Secondary | ICD-10-CM | POA: Diagnosis present

## 2012-05-23 DIAGNOSIS — R002 Palpitations: Secondary | ICD-10-CM | POA: Diagnosis present

## 2012-05-23 DIAGNOSIS — M79602 Pain in left arm: Secondary | ICD-10-CM

## 2012-05-23 DIAGNOSIS — R0989 Other specified symptoms and signs involving the circulatory and respiratory systems: Secondary | ICD-10-CM | POA: Diagnosis present

## 2012-05-23 DIAGNOSIS — Z7982 Long term (current) use of aspirin: Secondary | ICD-10-CM

## 2012-05-23 DIAGNOSIS — M79609 Pain in unspecified limb: Principal | ICD-10-CM | POA: Diagnosis present

## 2012-05-23 DIAGNOSIS — I10 Essential (primary) hypertension: Secondary | ICD-10-CM | POA: Diagnosis present

## 2012-05-23 DIAGNOSIS — I252 Old myocardial infarction: Secondary | ICD-10-CM

## 2012-05-23 DIAGNOSIS — I519 Heart disease, unspecified: Secondary | ICD-10-CM | POA: Diagnosis present

## 2012-05-23 DIAGNOSIS — R001 Bradycardia, unspecified: Secondary | ICD-10-CM

## 2012-05-23 DIAGNOSIS — G8929 Other chronic pain: Secondary | ICD-10-CM | POA: Diagnosis present

## 2012-05-23 LAB — CBC
HCT: 38.9 % (ref 36.0–46.0)
MCH: 31.1 pg (ref 26.0–34.0)
MCH: 30.2 pg (ref 26.0–34.0)
MCHC: 35 g/dL (ref 30.0–36.0)
Platelets: 222 10*3/uL (ref 150–400)
RBC: 4.31 MIL/uL (ref 3.87–5.11)
RDW: 12.9 % (ref 11.5–15.5)
RDW: 12.8 % (ref 11.5–15.5)
WBC: 5.4 10*3/uL (ref 4.0–10.5)

## 2012-05-23 LAB — COMPREHENSIVE METABOLIC PANEL
Albumin: 3.5 g/dL (ref 3.5–5.2)
Alkaline Phosphatase: 79 U/L (ref 39–117)
BUN: 21 mg/dL (ref 6–23)
Calcium: 9.3 mg/dL (ref 8.4–10.5)
Potassium: 4.1 mEq/L (ref 3.5–5.1)
Sodium: 140 mEq/L (ref 135–145)
Total Protein: 6.8 g/dL (ref 6.0–8.3)

## 2012-05-23 LAB — CREATININE, SERUM
Creatinine, Ser: 0.93 mg/dL (ref 0.50–1.10)
GFR calc Af Amer: 60 mL/min — ABNORMAL LOW (ref >90)

## 2012-05-23 LAB — TROPONIN I: Troponin I: <0.3 ng/mL (ref <0.30)

## 2012-05-23 LAB — PRO B NATRIURETIC PEPTIDE: Pro B Natriuretic peptide (BNP): 829.5 pg/mL — ABNORMAL HIGH (ref 0–450)

## 2012-05-23 LAB — MAGNESIUM: Magnesium: 1.9 mg/dL (ref 1.5–2.5)

## 2012-05-23 LAB — POCT I-STAT TROPONIN I: Troponin i, poc: 0.01 ng/mL (ref 0.00–0.08)

## 2012-05-23 MED ORDER — PANTOPRAZOLE SODIUM 40 MG PO TBEC: 40.0000 mg | DELAYED_RELEASE_TABLET | Freq: Every day | ORAL | Status: DC

## 2012-05-23 MED ORDER — ACETAMINOPHEN 325 MG PO TABS: 650.0000 mg | ORAL_TABLET | ORAL | Status: DC | PRN

## 2012-05-23 MED ORDER — SOTALOL HCL 80 MG PO TABS
40.0000 mg | ORAL_TABLET | Freq: Every day | ORAL | Status: DC
  Administered 2012-05-24: 40 mg via ORAL
  Filled 2012-05-23: qty 0.5

## 2012-05-23 MED ORDER — SODIUM CHLORIDE 0.9 % IJ SOLN
3.0000 mL | Freq: Two times a day (BID) | INTRAMUSCULAR | Status: DC
  Administered 2012-05-23 – 2012-05-24 (×2): 3 mL via INTRAVENOUS

## 2012-05-23 MED ORDER — SODIUM CHLORIDE 0.9 % IV SOLN: 250.0000 mL | INTRAVENOUS | Status: DC | PRN

## 2012-05-23 MED ORDER — FUROSEMIDE 10 MG/ML IJ SOLN
20.0000 mg | Freq: Once | INTRAMUSCULAR | Status: AC
  Administered 2012-05-23: 20 mg via INTRAVENOUS

## 2012-05-23 MED ORDER — ISOSORBIDE MONONITRATE 15 MG HALF TABLET
15.0000 mg | ORAL_TABLET | Freq: Every day | ORAL | Status: DC
  Administered 2012-05-23: 15 mg via ORAL
  Filled 2012-05-23 (×2): qty 1

## 2012-05-23 MED ORDER — POTASSIUM CHLORIDE 20 MEQ/15ML (10%) PO LIQD
10.0000 meq | Freq: Once | ORAL | Status: AC
  Administered 2012-05-23: 10 meq via ORAL
  Filled 2012-05-23: qty 7.5

## 2012-05-23 MED ORDER — ASPIRIN EC 81 MG PO TBEC
81.0000 mg | DELAYED_RELEASE_TABLET | Freq: Every day | ORAL | Status: DC
  Administered 2012-05-24: 81 mg via ORAL
  Filled 2012-05-23: qty 1

## 2012-05-23 MED ORDER — ISOSORBIDE MONONITRATE ER 30 MG PO TB24
30.0000 mg | ORAL_TABLET | Freq: Every day | ORAL | Status: DC
  Administered 2012-05-24: 30 mg via ORAL
  Filled 2012-05-23: qty 1

## 2012-05-23 MED ORDER — ISOSORBIDE MONONITRATE ER 30 MG PO TB24: 30.0000 mg | ORAL_TABLET | Freq: Two times a day (BID) | ORAL | Status: DC

## 2012-05-23 MED ORDER — MAGNESIUM OXIDE 400 MG PO TABS
400.0000 mg | ORAL_TABLET | Freq: Two times a day (BID) | ORAL | Status: DC
  Administered 2012-05-23 – 2012-05-24 (×2): 400 mg via ORAL
  Filled 2012-05-23 (×4): qty 1

## 2012-05-23 MED ORDER — NITROGLYCERIN 0.4 MG SL SUBL: 0.4000 mg | SUBLINGUAL_TABLET | SUBLINGUAL | Status: DC | PRN

## 2012-05-23 MED ORDER — SODIUM CHLORIDE 0.9 % IJ SOLN: 3.0000 mL | INTRAMUSCULAR | Status: DC | PRN

## 2012-05-23 MED ORDER — OMEPRAZOLE 20 MG PO CPDR
20.0000 mg | DELAYED_RELEASE_CAPSULE | Freq: Every day | ORAL | Status: DC
  Administered 2012-05-24: 20 mg via ORAL
  Filled 2012-05-23: qty 1

## 2012-05-23 MED ORDER — AMLODIPINE BESYLATE 10 MG PO TABS
10.0000 mg | ORAL_TABLET | Freq: Every day | ORAL | Status: DC
  Administered 2012-05-24: 10 mg via ORAL
  Filled 2012-05-23: qty 1

## 2012-05-23 MED ORDER — ENOXAPARIN SODIUM 30 MG/0.3ML ~~LOC~~ SOLN
30.0000 mg | SUBCUTANEOUS | Status: DC
  Administered 2012-05-23: 30 mg via SUBCUTANEOUS
  Filled 2012-05-23 (×2): qty 0.3

## 2012-05-23 MED ORDER — ONDANSETRON HCL 4 MG/2ML IJ SOLN: 4.0000 mg | Freq: Four times a day (QID) | INTRAMUSCULAR | Status: DC | PRN

## 2012-05-23 NOTE — H&P (Signed)
History and Physical   Patient ID: SYDNEI OHAVER MRN: [id number], DOB/AGE: 77-May-1919   Admit date: 05/23/2012 Date of Consult: 05/23/2012  Primary Physician: Default, Provider, MD Primary Cardiologist: B. Jens Som, MD  HPI: [name] is a 79 y.o. female with PMHx s/f CAD, PAF (on sotalol, low-dose ASA), HTN, MVP, diastolic dysfunction, chronic DOE, chronic back pain and osteoporosis who presents to [facility] ED with left pain and EKG changes.   She has a history of remote MI in 1989 described as mild in the past. No cardiac catheterization or intervention performed. Medical management was pursued.   She is followed by Dr. Jens Som. She has remained relatively stable over the past year from a cardiac standpoint. She has chronic DOE and occasional pedal edema. Repeat echo 03/18/12 revealed EF 55-60%, mild LVH, grade 2 dd, mild AI, mild MR moderate LA dilatation, PASP 36 mmHg. No mention of MVP. CAD has been stable and managed conservatively. She was seen as a work-in visit at the end of last month for palpitations. EKG in the office revealed NSR/bradycardia, HR 50s, with PACs. This was suspected to be attributed to PACs, and conservative monitoring was planned. However, she continued to have palpitations and was started on an event monitor yesterday.   She reports experiencing sharp, localized left arm pain on the inner volar aspect just proximal to her AC fossa beginning around 7 AM this morning. She has chronic left upper extremity tenderness which has been attributed to musculoskeletal causes in the past. She was more active than usual yesterday carrying her purse on her arm into our office and out to dinner yesterday. No aggravation with movement. Mild tenderness on palpation. No arm swelling, redness, fevers or chills. She denies chest pain or jaw pain. She reports chronic DOE which is unchanged. She still reports palpitations with associated weakness. HR as low as 40s at home. No  falls. Denies presyncope or syncope. She does report have recent incidents of gasping for air in the middle of the night. The discomfort persisted and she presented to the ED.   In the ED, EKG shows more pronounced, new downsloping ST depressions with associated TWIs in V5,V6 when compared to last month's tracings. PACs noted again. Initial trop-I WNL. BMET and CBC unremarkable. No CXR ordered. Prior radiograph 02/14 showed no acute abnormality and mild shoulder degenerative changes.   Problem List: Past Medical History  Diagnosis Date  . Hypertension   . CAD (coronary artery disease)   . Mitral valve prolapse   . Osteoporosis   . Compression fracture of vertebral column   . Atrial fibrillation     on sotalol, rate controlled  . Hyponatremia   . Accelerated hypertension   . Myocardial infarct, old 1989    which was thought to be mild, did not require any cardiac catheterization or angioplasty      . Chronic back pain     History reviewed. No pertinent past surgical history.   Allergies:  Allergies  Allergen Reactions  . Atenolol Other (See Comments)    Unknown   . Ciprofloxacin Other (See Comments)    Unknown   . Doxycycline Other (See Comments)    Unknown   . Escitalopram Oxalate Other (See Comments)    Unknown   . Moexipril Other (See Comments)    Unknown   . Pantoprazole Sodium Other (See Comments)    Unknown   . Propranolol Other (See Comments)    Unknown     Home  Medications: Prior to Admission medications   Medication Sig Start Date End Date Taking? Authorizing Provider  amLODipine (NORVASC) 10 MG tablet Take 10 mg by mouth daily.   Yes Historical Provider, MD  aspirin EC 81 MG tablet Take 81 mg by mouth daily.   Yes Historical Provider, MD  isosorbide mononitrate (IMDUR) 30 MG 24 hr tablet Take 15-30 mg by mouth 2 (two) times daily. Take one tablet by mouth every morning and one half tablet by mouth every evening 03/01/12  Yes Historical Provider, MD  magnesium  oxide (MAG-OX) 400 MG tablet Take 400 mg by mouth 2 (two) times daily.   Yes Historical Provider, MD  nitroGLYCERIN (NITROSTAT) 0.4 MG SL tablet Place 0.4 mg under the tongue every 5 (five) minutes as needed for chest pain.   Yes Historical Provider, MD  omeprazole (PRILOSEC) 20 MG capsule Take 20 mg by mouth daily.   Yes Historical Provider, MD  sotalol (BETAPACE) 80 MG tablet Take 40 mg by mouth 2 (two) times daily. 1/2 table (40 mg) two times daily   Yes Historical Provider, MD    Inpatient Medications:    (Not in a hospital admission)  Family History  Problem Relation Age of Onset  . Heart disease      Family History : Positive for     History   Social History  . Marital Status: Widowed    Spouse Name: N/A    Number of Children: N/A  . Years of Education: N/A   Occupational History  . Not on file.   Social History Main Topics  . Smoking status: Never Smoker   . Smokeless tobacco: Never Used  . Alcohol Use: No  . Drug Use: No  . Sexually Active: Not Currently   Other Topics Concern  . Not on file   Social History Narrative   PAST MEDICAL HISTORY:  Positive for history AFib in the past, but not on anticoagulation, she is just on aspirin.  History of hypertension.  She had an MI in 26 which was thought to be mild, did not require any cardiac catheterization or angioplasty, history of mitral valve prolapse, history of hypertension.  Denies any surgeries at all whatsoever.             SOCIAL HISTORY:  She lives in [place].  She lives by herself.  She usually is very independent.  She cleans and cokes herself.  No smoking, alcohol, or illicit drug use.  There is no living will according to the daughter.             FAMILY HISTORY:  Positive for heart disease.     Review of Systems: General: positive for weakness, negative for chills, fever, night sweats or weight changes.  Cardiovascular: positive for palpitations, DOE/SOB, negative for chest pain, edema, orthopnea,  paroxysmal nocturnal dyspnea Dermatological: negative for rash Respiratory:  negative for cough or wheezing Urologic: negative for hematuria Abdominal:  negative for nausea, vomiting, diarrhea, bright red blood per rectum, melena, or hematemesis Neurologic: negative for visual changes, syncope, or dizziness All other systems reviewed and are otherwise negative except as noted above.  Physical Exam: Blood pressure 116/48, pulse 51, temperature 97.7 F (36.5 C), temperature source Oral, resp. rate 16, height 5\' 1"  (1.549 m), weight 53.524 kg (118 lb), SpO2 93.00%.    General: Elderly, thin, well developed, in no acute distress. Head: Normocephalic, atraumatic, sclera non-icteric, no xanthomas, nares are without discharge.  Neck: Negative for carotid bruits. JVP 8-9 cm.  Lungs: Clear bilaterally to auscultation without wheezes, rales, or rhonchi. Breathing is unlabored. Heart: RRR with S1 S2. No murmurs, rubs, or gallops appreciated. Abdomen: Soft, non-tender, non-distended with normoactive bowel sounds. No hepatomegaly. No rebound/guarding. No obvious abdominal masses. Msk:  Strength and tone appears normal for age. Extremities: No clubbing, cyanosis or edema.  Distal pedal pulses are 2+ and equal bilaterally. Neuro: Alert and oriented X 3. Moves all extremities spontaneously. Psych:  Responds to questions appropriately with a normal affect.  Labs: Recent Labs     05/23/12  1029  WBC  6.1  HGB  13.6  HCT  38.9  MCV  89.0  PLT  223    Recent Labs Lab 05/23/12 1029  NA 140  K 4.1  CL 103  CO2 31  BUN 21  CREATININE 0.90  CALCIUM 9.3  PROT 6.8  BILITOT 0.4  ALKPHOS 79  ALT 10  AST 22  GLUCOSE 112*   Radiology/Studies: No results found.  EKG: NSR, 65 bpm, poor R wave progression, 1mm downsloping ST depressions V5, V6 with TWIs, TWIs I, aVL  ASSESSMENT AND PLAN:   79 y.o. female with PMHx s/f CAD, PAF (on sotalol, low-dose ASA), HTN, MVP, diastolic dysfunction,  chronic DOE, chronic back pain and osteoporosis who presents to [facility] ED with jaw pain and EKG changes.   1. Left arm pain- suspect musculoskeletal cause. Sharp, localizable and reproducible on palpation. No edema, erythema or calor to suggest upper extremity venous thrombosis. This would be a fairly atypical ACS presentation. She did note some relief with NTG.   2. CAD/remote MI- noted in 1989. Medically managed. More pronounced ST depressions V5, V6 from prior tracings. Initial trop-I WNL. The patient's presenting complaint of left-arm pain is fairly atypical, but she may present atypically as she is elderly and female. Even if representative of ACS, given her age and comorbidities, management would revolve around conservative medical therapy. She does report nitrate responsiveness.   - Observe overnight  - Cycle cardiac biomarkers  - Increase Imdur to 60mg  daily  - Continue ASA   3. Paroxysmal atrial fibrillation- maintaining NSR in the ED.   - Continue sotalol (see below)  - Hold off on anticoagulation given increased bleeding risk   4. Palpitations- suspect secondary to PACs. She does note what sounds like episodic apnea at night. Question whether undiagnosed sleep apnea may be contributing to atrial ectopy.   - Obtain overnight oxygen saturation  - Check Mg, TSH  5. Hypertension- well-controlled in the ED.   - Monitor with increase in Imdur  6. Tachy-brady syndrome- she reports bradycardia with HR in the 30-40s at home. Off sotalol, she has had trouble with atrial fibrillation.   - Agree with continuing sotalol to maintain NSR. If she becomes symptomatic with her bradycardia, could consider switching to amiodarone  - Will monitor rhythm on telemetry overnight  7. Diastolic dysfunction- check pro BNP with elevated JVD on exam.   - Give one dose Lasix 20mg  IV     Signed, R. Hurman Horn, PA-C 05/23/2012, 2:24 PM   Attending Note:   The patient was seen and examined.   Agree with assessment and plan as noted above.  Changes made to the above note as needed.  Pt presents to atypical angina like pain (left arm pain) .  The pain was partially relieved with NTG.  The pain was not similar to her previous angina  Pain.   She does have concerning ECG changes that are  c/w angina.  Her initial Troponin is negative.  She has significant bradycardia on the sotolol.  We have looked up the dose and her sotolol should be dosed daily. (not BID) .  Her her has been very slow.  She denies any syncope but she does have occasional episodes of weakness.    She is elderly and quite frail and I think that conservative management is indicated.  We will observe her overnight.  I anticipate DC tomorrow if her enzymes are negative.   Will decrease her sotolol to 40 QD.  I think we should consider another antiarrhythmic ( ie , perhaps amiodarone) since solotol causes bradycardia frequently.   Will let Dr. Jens Som address this as OP.    Vesta Mixer, Montez Hageman., MD, [facility] 05/23/2012, 3:46 PM

## 2012-05-23 NOTE — ED Provider Notes (Signed)
History     CSN: [id number]  Arrival date & time 05/23/12  3086   First MD Initiated Contact with Patient 05/23/12 1024      Chief Complaint  Patient presents with  . Arm Pain    (Consider location/radiation/quality/duration/timing/severity/associated sxs/prior treatment) HPI Pt states she carried her heavy purse all day yesterday and woke this morning with L bicep and shoulder pain. States she was told by her PMD to take NTG if her arm started hurting. She took 3 NTG prior to arrival with no resolution. No CP, SOB, fever chills, lower ext swelling or pain. No trauma, redness or swelling to arm.  Past Medical History  Diagnosis Date  . Hypertension   . CAD (coronary artery disease)   . Mitral valve prolapse   . Osteoporosis   . Compression fracture of vertebral column   . Atrial fibrillation     on sotalol, rate controlled  . Hyponatremia   . Accelerated hypertension   . Myocardial infarct, old 1989    which was thought to be mild, did not require any cardiac catheterization or angioplasty      . Chronic back pain     History reviewed. No pertinent past surgical history.  Family History  Problem Relation Age of Onset  . Heart disease      Family History : Positive for    History  Substance Use Topics  . Smoking status: Never Smoker   . Smokeless tobacco: Never Used  . Alcohol Use: No    OB History   Grav Para Term Preterm Abortions TAB SAB Ect Mult Living                  Review of Systems  Constitutional: Negative for fever and chills.  Respiratory: Negative for cough and shortness of breath.   Cardiovascular: Negative for chest pain, palpitations and leg swelling.  Gastrointestinal: Negative for nausea, vomiting and abdominal pain.  Musculoskeletal: Positive for myalgias. Negative for back pain.  Skin: Negative for rash and wound.  Neurological: Negative for dizziness, weakness, numbness and headaches.  All other systems reviewed and are  negative.    Allergies  Atenolol; Ciprofloxacin; Doxycycline; Escitalopram oxalate; Moexipril; Pantoprazole sodium; and Propranolol  Home Medications   Current Outpatient Rx  Name  Route  Sig  Dispense  Refill  . amLODipine (NORVASC) 10 MG tablet   Oral   Take 10 mg by mouth daily.         Marland Kitchen aspirin EC 81 MG tablet   Oral   Take 81 mg by mouth daily.         . isosorbide mononitrate (IMDUR) 30 MG 24 hr tablet   Oral   Take 15-30 mg by mouth 2 (two) times daily. Take one tablet by mouth every morning and one half tablet by mouth every evening         . magnesium oxide (MAG-OX) 400 MG tablet   Oral   Take 400 mg by mouth 2 (two) times daily.         . nitroGLYCERIN (NITROSTAT) 0.4 MG SL tablet   Sublingual   Place 0.4 mg under the tongue every 5 (five) minutes as needed for chest pain.         Marland Kitchen omeprazole (PRILOSEC) 20 MG capsule   Oral   Take 20 mg by mouth daily.         . sotalol (BETAPACE) 80 MG tablet   Oral   Take 0.5 tablets (40  mg total) by mouth daily.           BP 110/60  Pulse 52  Temp(Src) 97.5 F (36.4 C) (Oral)  Resp 20  Ht 5\' 1"  (1.549 m)  Wt 114 lb 1.6 oz (51.755 kg)  BMI 21.57 kg/m2  SpO2 99%  Physical Exam  Nursing note and vitals reviewed. Constitutional: She is oriented to person, place, and time. She appears well-developed and well-nourished. No distress.  HENT:  Head: Normocephalic and atraumatic.  Mouth/Throat: Oropharynx is clear and moist.  Eyes: EOM are normal. Pupils are equal, round, and reactive to light.  Neck: Normal range of motion. Neck supple.  Cardiovascular: Normal rate and regular rhythm.   Pulmonary/Chest: Effort normal and breath sounds normal. No respiratory distress. She has no wheezes. She has no rales. She exhibits no tenderness.  Abdominal: Soft. Bowel sounds are normal. She exhibits no distension and no mass. There is no tenderness. There is no rebound and no guarding.  Musculoskeletal: Normal  range of motion. She exhibits tenderness (TTP over L bicep and deltoid. FROM of elbow and shoulder. 2+ radial pulse. No trauma, swelling or redness). She exhibits no edema.  Neurological: She is alert and oriented to person, place, and time.  Skin: Skin is warm and dry. No rash noted. No erythema.  Psychiatric: She has a normal mood and affect. Her behavior is normal.    ED Course  Procedures (including critical care time)  Labs Reviewed  COMPREHENSIVE METABOLIC PANEL - Abnormal; Notable for the following:    Glucose, Bld 112 (*)    GFR calc non Af Amer 54 (*)    GFR calc Af Amer 62 (*)    All other components within normal limits  BASIC METABOLIC PANEL - Abnormal; Notable for the following:    CO2 34 (*)    Creatinine, Ser 1.19 (*)    GFR calc non Af Amer 38 (*)    GFR calc Af Amer 45 (*)    All other components within normal limits  CREATININE, SERUM - Abnormal; Notable for the following:    GFR calc non Af Amer 52 (*)    GFR calc Af Amer 60 (*)    All other components within normal limits  PRO B NATRIURETIC PEPTIDE - Abnormal; Notable for the following:    Pro B Natriuretic peptide (BNP) 829.5 (*)    All other components within normal limits  CBC  TROPONIN I  TROPONIN I  TROPONIN I  CBC  MAGNESIUM  TSH  CBC  POCT I-STAT TROPONIN I   No results found.   1. Arm pain, left   2. Abnormal finding on EKG   3. Atrial fibrillation   4. CAD (coronary artery disease)       MDM   Discussed with [facility]. Will see in ED and disposition.        Loren Racer, MD 05/26/12 [phone]

## 2012-05-23 NOTE — ED Notes (Cosign Needed)
Lunch tray ordered 

## 2012-05-23 NOTE — Progress Notes (Signed)
Report received from ED nurse Mardella Layman; patient has arrived on unit; will continue to monitor patient. Lorretta Harp RN

## 2012-05-23 NOTE — ED Notes (Signed)
Pt states that she has been having pain in her left arm and shoulder last night. Pt states that she was still having pain this morning so she took 3 nitro tablets, which helped the pain in her arm. Pt reports pain came back, so her daughter called the pt's cardiologist, and was advised to come into the ER.

## 2012-05-24 DIAGNOSIS — M79609 Pain in unspecified limb: Principal | ICD-10-CM

## 2012-05-24 DIAGNOSIS — R001 Bradycardia, unspecified: Secondary | ICD-10-CM

## 2012-05-24 DIAGNOSIS — M79602 Pain in left arm: Secondary | ICD-10-CM

## 2012-05-24 LAB — BASIC METABOLIC PANEL
BUN: 22 mg/dL (ref 6–23)
CO2: 34 mEq/L — ABNORMAL HIGH (ref 19–32)
Calcium: 9.6 mg/dL (ref 8.4–10.5)
Creatinine, Ser: 1.19 mg/dL — ABNORMAL HIGH (ref 0.50–1.10)
GFR calc Af Amer: 45 mL/min — ABNORMAL LOW (ref >90)
GFR calc non Af Amer: 38 mL/min — ABNORMAL LOW (ref >90)
Glucose, Bld: 94 mg/dL (ref 70–99)
Sodium: 141 mEq/L (ref 135–145)

## 2012-05-24 LAB — CBC
HCT: 38.4 % (ref 36.0–46.0)
Hemoglobin: 13.1 g/dL (ref 12.0–15.0)
MCH: 30.4 pg (ref 26.0–34.0)
MCHC: 34.1 g/dL (ref 30.0–36.0)
MCV: 89.1 fL (ref 78.0–100.0)
Platelets: 226 10*3/uL (ref 150–400)
RBC: 4.31 MIL/uL (ref 3.87–5.11)
RDW: 12.9 % (ref 11.5–15.5)
WBC: 6.5 10*3/uL (ref 4.0–10.5)

## 2012-05-24 MED ORDER — SOTALOL HCL 80 MG PO TABS: 40.0000 mg | ORAL_TABLET | Freq: Every day | ORAL | Status: DC

## 2012-05-24 NOTE — Discharge Summary (Signed)
Patient ID: [name],  MRN: [id number], DOB/AGE: April 18, 1917 79 y.o.  Admit date: 05/23/2012 Discharge date: 05/24/2012  Primary Care Provider: Default, Provider Primary Cardiologist: B. Crenshaw, MD  Discharge Diagnoses Principal Problem:   Left arm pain Active Problems:   Bradycardia   CAD (coronary artery disease)   Paroxysmal Atrial fibrillation   Hypertension  Allergies Allergies  Allergen Reactions  . Atenolol Other (See Comments)    Unknown   . Ciprofloxacin Other (See Comments)    Unknown   . Doxycycline Other (See Comments)    Unknown   . Escitalopram Oxalate Other (See Comments)    Unknown   . Moexipril Other (See Comments)    Unknown   . Pantoprazole Sodium Other (See Comments)    Unknown   . Propranolol Other (See Comments)    Unknown     Procedures  none  History of Present Illness  79 year old female with prior history of coronary artery disease as well as atrial fibrillation, which is managed with sotalol therapy.  She was in her usual state of health until the morning of admission when she began to experience sharp, localized, left arm pain. She presented to the [facility] for evaluation secondary to persistence of this discomfort and there, she was noted to have mild tenderness of the left arm upon palpation. Her ECG showed downsloping ST segment depressions associated T-wave inversion in V5 and V6 which was more pronounced compared with old tracings. She was also bradycardic and upon review of her home sotalol dose, it was noted that she should have been taking 40 mg daily but in fact was taking 40 mg twice a day. She was placed in observation.  Hospital Course  Patient ruled out for myocardial infarction. Her sotalol dose was adjusted to 40 mg daily and heart rates remained stable in the 50s. She did not have any chest pain in her left arm discomfort also improved. In the absence of objective evidence of ischemia, she will be discharged home today  without plan for additional ischemic evaluation at this time. We'll arrange for followup within the next 2 weeks.  Discharge Vitals Blood pressure 110/60, pulse 52, temperature 97.5 F (36.4 C), temperature source Oral, resp. rate 20, height 5\' 1"  (1.549 m), weight 114 lb 1.6 oz (51.755 kg), SpO2 99.00%.  Filed Weights   05/23/12 1008 05/23/12 1753 05/24/12 0706  Weight: 118 lb (53.524 kg) 117 lb (53.071 kg) 114 lb 1.6 oz (51.755 kg)    Labs  CBC  Recent Labs  05/23/12 1904 05/24/12 0518  WBC 5.4 6.5  HGB 13.0 13.1  HCT 38.4 38.4  MCV 89.1 89.1  PLT 222 226   Basic Metabolic Panel  Recent Labs  05/23/12 1029 05/23/12 1904 05/24/12 0518  NA 140  --  141  K 4.1  --  4.0  CL 103  --  100  CO2 31  --  34*  GLUCOSE 112*  --  94  BUN 21  --  22  CREATININE 0.90 0.93 1.19*  CALCIUM 9.3  --  9.6  MG  --  1.9  --    Liver Function Tests  Recent Labs  05/23/12 1029  AST 22  ALT 10  ALKPHOS 79  BILITOT 0.4  PROT 6.8  ALBUMIN 3.5   Cardiac Enzymes  Recent Labs  05/23/12 1459 05/23/12 1904 05/23/12 2334  TROPONINI <0.30 <0.30 <0.30   Thyroid Function Tests  Recent Labs  05/23/12 1904  TSH 1.992  Disposition  Pt is being discharged home today in good condition.  Follow-up Plans & Appointments  Follow-up Information   Follow up with Olga Millers, MD In 2 weeks. (we will arrange)    Contact information:   [address] [place], [address] [place] [address] [phone]       Discharge Medications    Medication List    TAKE these medications       amLODipine 10 MG tablet  Commonly known as:  NORVASC  Take 10 mg by mouth daily.     aspirin EC 81 MG tablet  Take 81 mg by mouth daily.     isosorbide mononitrate 30 MG 24 hr tablet  Commonly known as:  IMDUR  Take 15-30 mg by mouth 2 (two) times daily. Take one tablet by mouth every morning and one half tablet by mouth every evening     magnesium oxide 400 MG tablet  Commonly known as:   MAG-OX  Take 400 mg by mouth 2 (two) times daily.     nitroGLYCERIN 0.4 MG SL tablet  Commonly known as:  NITROSTAT  Place 0.4 mg under the tongue every 5 (five) minutes as needed for chest pain.     omeprazole 20 MG capsule  Commonly known as:  PRILOSEC  Take 20 mg by mouth daily.     sotalol 80 MG tablet  Commonly known as:  BETAPACE  Take 0.5 tablets (40 mg total) by mouth daily.        Outstanding Labs/Studies  none  Duration of Discharge Encounter   Greater than 30 minutes including physician time.  Signed, Nicolasa Ducking NP 05/24/2012, 1:46 PM

## 2012-05-24 NOTE — Progress Notes (Signed)
Utilization Review Completed.   Sandon Yoho, RN, BSN Nurse Case Manager  [phone]  

## 2012-05-24 NOTE — Progress Notes (Signed)
Primary cardiologist: Dr. Olga Millers   Subjective:   Feels better, no chest pain, left arm also improved. Less sense of palpitation.   Objective:   Temp:  [97.5 F (36.4 C)-98.3 F (36.8 C)] 97.5 F (36.4 C) (04/19 0706) Pulse Rate:  [51-77] 52 (04/19 0706) Resp:  [16-23] 20 (04/19 0706) BP: (87-149)/(44-71) 110/60 mmHg (04/19 1010) SpO2:  [92 %-99 %] 99 % (04/19 0706) Weight:  [114 lb 1.6 oz (51.755 kg)-117 lb (53.071 kg)] 114 lb 1.6 oz (51.755 kg) (04/19 0706) Last BM Date: 05/23/12  Filed Weights   05/23/12 1008 05/23/12 1753 05/24/12 0706  Weight: 118 lb (53.524 kg) 117 lb (53.071 kg) 114 lb 1.6 oz (51.755 kg)   Telemetry: Sinus rhythm, sinus bradycardia, PACs.  Exam:  General: Elderly, no acute distress.  Lungs: Clear, nonlabored.  Cardiac: RRR with ectopy, no gallop.  Extremities: No pitting.  Lab Results:  Basic Metabolic Panel:  Recent Labs Lab 05/23/12 1029 05/23/12 1904 05/24/12 0518  NA 140  --  141  K 4.1  --  4.0  CL 103  --  100  CO2 31  --  34*  GLUCOSE 112*  --  94  BUN 21  --  22  CREATININE 0.90 0.93 1.19*  CALCIUM 9.3  --  9.6  MG  --  1.9  --     Liver Function Tests:  Recent Labs Lab 05/23/12 1029  AST 22  ALT 10  ALKPHOS 79  BILITOT 0.4  PROT 6.8  ALBUMIN 3.5    CBC:  Recent Labs Lab 05/23/12 1029 05/23/12 1904 05/24/12 0518  WBC 6.1 5.4 6.5  HGB 13.6 13.0 13.1  HCT 38.9 38.4 38.4  MCV 89.0 89.1 89.1  PLT 223 222 226    Cardiac Enzymes:  Recent Labs Lab 05/23/12 1459 05/23/12 1904 05/23/12 2334  TROPONINI <0.30 <0.30 <0.30    BNP:  Recent Labs  03/14/12 1202 05/23/12 1904  PROBNP 170.0* 829.5*    ECG: Sinus rhythm with inferolateral ST-T wave changes, somewhat less pronounced compared to prior tracing.   Medications:   Scheduled Medications: . amLODipine  10 mg Oral Daily  . aspirin EC  81 mg Oral Daily  . enoxaparin (LOVENOX) injection  30 mg Subcutaneous Q24H  . isosorbide  mononitrate  15 mg Oral QHS  . isosorbide mononitrate  30 mg Oral Daily  . magnesium oxide  400 mg Oral BID  . omeprazole  20 mg Oral Daily  . sodium chloride  3 mL Intravenous Q12H  . sotalol  40 mg Oral Daily     PRN Medications:  sodium chloride, acetaminophen, nitroGLYCERIN, ondansetron (ZOFRAN) IV, sodium chloride   Assessment:   1. Presentation with atypical left arm discomfort, possibly atypical angina - reporting some improvement with nitroglycerin. Cardiac markers argue against ACS and she feels better this morning. Plan is for conservative medical therapy.  2. Paroxysmal atrial fibrillation, currently in sinus rhythm. She is not on anticoagulation as an outpatient. Sotalol dose was reduced to 40 mg daily.  3. Tachycardia-bradycardia syndrome. As noted, sotalol dose was reduced. No other rate control medications.  4. Known history of CAD status post remote MI, has been managed conservatively. ECG with inferolateral ST-T wave changes, improved this morning.   Plan/Discussion:    Patient for discharge home today, spoke with her daughter at bedside. Continue current medications including aspirin, Norvasc, Imdur at 45 mg daily, and sotalol at 40 mg daily. She will need followup with Dr. Jens Som  or Mr. Alben Spittle within the next 2 weeks.   Jonelle Sidle, M.D., F.A.C.C.

## 2012-05-25 NOTE — Discharge Summary (Signed)
Please see rounding note. 

## 2012-06-11 ENCOUNTER — Encounter: Payer: Self-pay | Admitting: Physician Assistant

## 2012-06-11 ENCOUNTER — Ambulatory Visit (INDEPENDENT_AMBULATORY_CARE_PROVIDER_SITE_OTHER): Payer: Medicare Other | Admitting: Physician Assistant

## 2012-06-11 VITALS — BP 112/58 | HR 59 | Ht 61.0 in | Wt 119.4 lb

## 2012-06-11 DIAGNOSIS — R002 Palpitations: Secondary | ICD-10-CM

## 2012-06-11 DIAGNOSIS — I498 Other specified cardiac arrhythmias: Secondary | ICD-10-CM

## 2012-06-11 DIAGNOSIS — R001 Bradycardia, unspecified: Secondary | ICD-10-CM

## 2012-06-11 DIAGNOSIS — M79602 Pain in left arm: Secondary | ICD-10-CM

## 2012-06-11 DIAGNOSIS — M79609 Pain in unspecified limb: Secondary | ICD-10-CM

## 2012-06-11 DIAGNOSIS — I4891 Unspecified atrial fibrillation: Secondary | ICD-10-CM

## 2012-06-11 DIAGNOSIS — I2510 Atherosclerotic heart disease of native coronary artery without angina pectoris: Secondary | ICD-10-CM

## 2012-06-11 DIAGNOSIS — I10 Essential (primary) hypertension: Secondary | ICD-10-CM

## 2012-06-11 NOTE — Progress Notes (Signed)
[address] [place], [place]  [address] Phone: [phone] Fax:  [phone]  Date:  06/11/2012   ID:  [name], DOB 1917/10/15, MRN [id number]  PCP:  Dr. Burnett Sheng in [place]  Primary Cardiologist:  Dr. Olga Millers     History of Present Illness: [name] is a 79 y.o. female who returns for follow up after a recent admission to the hospital 4/18-4/19 with left arm pain and bradycardia. She has a hx of atrial fibrillation and reported CAD[place] is a reported hx of myocardial infarction in 1989 treated medically. She has been maintained on sotalol for her atrial fibrillation.  Echo 03/2012: Mild LVH, EF 55-60%, grade 2 diastolic dysfunction, mild AI, mild MR, moderate LAE, PASP 36.  Last seen in this office by Norma Fredrickson, NP.  Patient complained of palpitations at that time. She remained in sinus rhythm. She was admitted to the hospital recently after experiencing sharp, localized, left arm pain. She was noted to be bradycardic. It was noted that she was taking sotalol twice daily instead of once daily. Her dose was adjusted to 40 mg once daily and heart rates remained stable in the 50s. Cardiac markers were negative for myocardial infarction. No further ischemic evaluation was recommended.  Of note, she called in prior to being admitted to the hospital and was placed on an event monitor for her palpitations.  She denies any chest pain. She still gets occasional positional left elbow pain.  No significant dyspnea.  No orthopnea, PND, edema.  No syncope.    Labs (4/14):  K 4, Cr 1.19, ALT 10, pBNP 829.5, Hgb 13.1, TSH 1.992  Wt Readings from Last 3 Encounters:  06/11/12 119 lb 6.4 oz (54.159 kg)  05/24/12 114 lb 1.6 oz (51.755 kg)  05/02/12 118 lb 6.4 oz (53.706 kg)     Past Medical History  Diagnosis Date  . Hypertension   . CAD (coronary artery disease)     a. s/p MI 1989 which was thought to be mild, did  not require any cardiac catheterization or angioplasty      . Mitral valve prolapse     a. Echo 03/2012: Mild LVH, EF 55-60%, grade 2 diastolic dysfunction, mild AI, mild MR, moderate LAE, PASP 36  . Osteoporosis   . Compression fracture of vertebral column   . Atrial fibrillation     on sotalol, rate controlled  . Hyponatremia   . Chronic back pain     Current Outpatient Prescriptions  Medication Sig Dispense Refill  . amLODipine (NORVASC) 10 MG tablet Take 10 mg by mouth daily.      Marland Kitchen aspirin EC 81 MG tablet Take 81 mg by mouth daily.      . isosorbide mononitrate (IMDUR) 30 MG 24 hr tablet Take 15-30 mg by mouth 2 (two) times daily. Take one tablet by mouth every morning and one half tablet by mouth every evening      . magnesium oxide (MAG-OX) 400 MG tablet Take 400 mg by mouth 2 (two) times daily.      . nitroGLYCERIN (NITROSTAT) 0.4 MG SL tablet Place 0.4 mg under the tongue every 5 (five) minutes as needed for chest pain.      Marland Kitchen omeprazole (PRILOSEC) 20 MG capsule Take 20 mg by mouth daily.      . sotalol (BETAPACE) 80 MG tablet Take 0.5 tablets (40 mg total) by mouth daily.  No current facility-administered medications for this visit.    Allergies:    Allergies  Allergen Reactions  . Atenolol Other (See Comments)    Unknown   . Ciprofloxacin Other (See Comments)    Unknown   . Doxycycline Other (See Comments)    Unknown   . Escitalopram Oxalate Other (See Comments)    Unknown   . Moexipril Other (See Comments)    Unknown   . Pantoprazole Sodium Other (See Comments)    Unknown   . Propranolol Other (See Comments)    Unknown     Social History:  The patient  reports that she has never smoked. She has never used smokeless tobacco. She reports that she does not drink alcohol or use illicit drugs.   ROS:  Please see the history of present illness.   All other systems reviewed and negative.   PHYSICAL EXAM: VS:  BP 112/58  Pulse 59  Ht 5\' 1"  (1.549 m)  Wt 119  lb 6.4 oz (54.159 kg)  BMI 22.57 kg/m2 Well nourished, well developed, in no acute distress HEENT: normal Neck: no JVD Cardiac:  normal S1, S2; RRR; no murmur Lungs:  clear to auscultation bilaterally, no wheezing, rhonchi or rales Abd: soft, nontender, no hepatomegaly Ext: no edema MSK:  Left elbow without deformity or point tenderness Skin: warm and dry Neuro:  CNs 2-12 intact, no focal abnormalities noted  EKG:  Sinus brady, HR 59, normal axis, PACs, septal Q waves, NSSTTW changes, no change from prior tracing     ASSESSMENT AND PLAN:  1. CAD:  Stable.  No angina.  Continue ASA.  Elbow pain not likely related to ischemia. 2. Arm Pain:  Recommend f/u with PCP. 3. Atrial Fibrillation:  Maintaining NSR.  Continue sotalol. 4. Hypertension:  Controlled.  Continue current therapy.  5. Bradycardia:  HR stable.  No further intervention.  Complete event monitor. 6. Palpitations:  Likely PACs.  She has 10 days left on event monitor. 7. Disposition:  F/u with Dr. Olga Millers in 3 mos.   Signed, Tereso Newcomer, PA-C  11:30 AM 06/11/2012

## 2012-06-11 NOTE — Patient Instructions (Addendum)
PLEASE FOLLOW UP IN 3 MONTHS WITH DR. CRENSHAW  NO CHANGES WERE MADE  PLEASE FINISH OUT WEARING THE HEART MONITOR FOR THE NEXT 10 DAYS AND THEN MAIL BACK IN TO THE OFFICE

## 2012-06-27 ENCOUNTER — Telehealth: Payer: Self-pay | Admitting: *Deleted

## 2012-06-27 NOTE — Telephone Encounter (Signed)
Left message for pt to call, monitor reviewed by dr Jens Som shows sinus with PAC's and atrial fib.

## 2012-07-01 NOTE — Telephone Encounter (Signed)
Follow up  ° ° °Returning call back to nurse from Friday  °

## 2012-07-01 NOTE — Telephone Encounter (Signed)
Spoke with pt dtr, aware of monitor results

## 2012-07-25 ENCOUNTER — Other Ambulatory Visit: Payer: Self-pay | Admitting: *Deleted

## 2012-07-25 MED ORDER — NITROGLYCERIN 0.4 MG SL SUBL: 0.4000 mg | SUBLINGUAL_TABLET | SUBLINGUAL | Status: AC | PRN

## 2012-09-10 ENCOUNTER — Other Ambulatory Visit: Payer: Self-pay

## 2012-09-11 ENCOUNTER — Encounter: Payer: Self-pay | Admitting: Cardiology

## 2012-09-11 ENCOUNTER — Ambulatory Visit (INDEPENDENT_AMBULATORY_CARE_PROVIDER_SITE_OTHER): Payer: Medicare Other | Admitting: Cardiology

## 2012-09-11 VITALS — BP 130/79 | HR 88 | Wt 117.0 lb

## 2012-09-11 DIAGNOSIS — I2510 Atherosclerotic heart disease of native coronary artery without angina pectoris: Secondary | ICD-10-CM

## 2012-09-11 DIAGNOSIS — I4891 Unspecified atrial fibrillation: Secondary | ICD-10-CM

## 2012-09-11 DIAGNOSIS — I10 Essential (primary) hypertension: Secondary | ICD-10-CM

## 2012-09-11 MED ORDER — APIXABAN 2.5 MG PO TABS: 2.5000 mg | ORAL_TABLET | Freq: Two times a day (BID) | ORAL | Status: DC

## 2012-09-11 MED ORDER — METOPROLOL SUCCINATE ER 25 MG PO TB24: ORAL_TABLET | ORAL | Status: DC

## 2012-09-11 NOTE — Progress Notes (Signed)
HPI: Pleasant female for fu of atrial fibrillation and reported CAD[place] There is a reported hx of myocardial infarction in 1989 treated medically. She has been maintained on sotalol for her atrial fibrillation. Echo 03/2012: Mild LVH, EF 55-60%, grade 2 diastolic dysfunction, mild AI, mild MR, moderate LAE, PASP 36. Monitor in April of 2014 showed sinus rhythm with PACs and paroxysmal atrial fibrillation. Since she was last seen, she denies increased dyspnea, chest pain, syncope. She has noticed increased palpitations. Mild unchanged pedal edema.   Current Outpatient Prescriptions  Medication Sig Dispense Refill  . amLODipine (NORVASC) 10 MG tablet Take 10 mg by mouth daily.      Marland Kitchen aspirin EC 81 MG tablet Take 81 mg by mouth daily.      . isosorbide mononitrate (IMDUR) 30 MG 24 hr tablet Take one tablet by mouth every morning and one half tablet by mouth every evening      . magnesium oxide (MAG-OX) 400 MG tablet Take 400 mg by mouth 2 (two) times daily.      . nitroGLYCERIN (NITROSTAT) 0.4 MG SL tablet Place 1 tablet (0.4 mg total) under the tongue every 5 (five) minutes as needed for chest pain.  25 tablet  3  . omeprazole (PRILOSEC) 20 MG capsule Take 20 mg by mouth daily.      . sotalol (BETAPACE) 80 MG tablet Take 0.5 tablets (40 mg total) by mouth daily.       No current facility-administered medications for this visit.     Past Medical History  Diagnosis Date  . Hypertension   . CAD (coronary artery disease)     a. s/p MI 1989 which was thought to be mild, did not require any cardiac catheterization or angioplasty      . Mitral valve prolapse     a. Echo 03/2012: Mild LVH, EF 55-60%, grade 2 diastolic dysfunction, mild AI, mild MR, moderate LAE, PASP 36  . Osteoporosis   . Compression fracture of vertebral column   . Atrial fibrillation     on sotalol, rate controlled  . Hyponatremia   . Chronic back pain     History reviewed. No pertinent  past surgical history.  History   Social History  . Marital Status: Widowed    Spouse Name: N/A    Number of Children: N/A  . Years of Education: N/A   Occupational History  . Not on file.   Social History Main Topics  . Smoking status: Never Smoker   . Smokeless tobacco: Never Used  . Alcohol Use: No  . Drug Use: No  . Sexually Active: Not Currently   Other Topics Concern  . Not on file   Social History Narrative   PAST MEDICAL HISTORY:  Positive for history AFib in the past, but not on anticoagulation, she is just on aspirin.  History of hypertension.  She had an MI in 85 which was thought to be mild, did not require any cardiac catheterization or angioplasty, history of mitral valve prolapse, history of hypertension.  Denies any surgeries at all whatsoever.             SOCIAL HISTORY:  She lives in [place].  She lives by herself.  She usually is very independent.  She cleans and cooks herself.  No smoking, alcohol, or illicit drug use.  There is no living will according to the daughter.  FAMILY HISTORY:  Positive for heart disease.    ROS: no fevers or chills, productive cough, hemoptysis, dysphasia, odynophagia, melena, hematochezia, dysuria, hematuria, rash, seizure activity, orthopnea, PND, pedal edema, claudication. Remaining systems are negative.  Physical Exam: Well-developed well-nourished in no acute distress.  Skin is warm and dry.  HEENT is normal.  Neck is supple.  Chest is clear to auscultation with normal expansion.  Cardiovascular exam is irregular Abdominal exam nontender or distended. No masses palpated. Extremities show trace edema. neuro grossly intact  ECG atrial fibrillation at a rate of 88. Right axis deviation. Nonspecific ST changes. Low voltage.

## 2012-09-11 NOTE — Assessment & Plan Note (Signed)
Conservative measures given age. 

## 2012-09-11 NOTE — Assessment & Plan Note (Addendum)
Patient has developed recurrent atrial fibrillation. She is relatively asymptomatic other than palpitations. I think rate control and anticoagulation would be the best long-term option. Plan to discontinue aspirin. Begin apixaban 2.5 mg by mouth twice a day. Check potassium and renal function in 4 weeks. Add Toprol 12.5 mg daily. Note she did have bradycardia during her recent hospitalization. Her sotalol was decreased because of this. I will check a 48 hour Holter monitor in one week to make sure that her rate is controlled. If she begins to develop symptoms we will rethink attempts at restoring sinus rhythm.

## 2012-09-11 NOTE — Assessment & Plan Note (Signed)
Continue present blood pressure medications. 

## 2012-09-11 NOTE — Patient Instructions (Addendum)
Your physician has recommended you make the following change in your medication:  STOP ASPRIN AND SOTALOL  START TOPROL 12.5 MG DAILY  START ELIQUIS 2.5 MG ONE TABLET TWICE DAILY  Your physician has recommended that you wear a 48 HOUR holter monitor. Holter monitors are medical devices that record the heart's electrical activity. Doctors most often use these monitors to diagnose arrhythmias. Arrhythmias are problems with the speed or rhythm of the heartbeat. The monitor is a small, portable device. You can wear one while you do your normal daily activities. This is usually used to diagnose what is causing palpitations/syncope (passing out).SCHEDULE IN ONE WEEK   Your physician recommends that you schedule a follow-up appointment in: 8 WEEKS WITH DR Jens Som  Your physician recommends that you return for lab work in: 4 WEEKS

## 2012-09-18 ENCOUNTER — Encounter: Payer: Self-pay | Admitting: *Deleted

## 2012-09-18 ENCOUNTER — Encounter (INDEPENDENT_AMBULATORY_CARE_PROVIDER_SITE_OTHER): Payer: Medicare Other

## 2012-09-18 DIAGNOSIS — I4891 Unspecified atrial fibrillation: Secondary | ICD-10-CM

## 2012-09-18 DIAGNOSIS — I2510 Atherosclerotic heart disease of native coronary artery without angina pectoris: Secondary | ICD-10-CM

## 2012-09-18 DIAGNOSIS — I10 Essential (primary) hypertension: Secondary | ICD-10-CM

## 2012-09-18 NOTE — Progress Notes (Signed)
Patient ID: [name], female   DOB: July 27, 1917, 79 y.o.   MRN: [id number] EVO 48 Hour holter monitor applied to patient.

## 2012-10-02 ENCOUNTER — Telehealth: Payer: Self-pay | Admitting: *Deleted

## 2012-10-02 DIAGNOSIS — I2510 Atherosclerotic heart disease of native coronary artery without angina pectoris: Secondary | ICD-10-CM

## 2012-10-02 DIAGNOSIS — I4891 Unspecified atrial fibrillation: Secondary | ICD-10-CM

## 2012-10-02 DIAGNOSIS — I10 Essential (primary) hypertension: Secondary | ICD-10-CM

## 2012-10-02 MED ORDER — METOPROLOL SUCCINATE ER 25 MG PO TB24: 25.0000 mg | ORAL_TABLET | Freq: Every day | ORAL | Status: DC

## 2012-10-02 NOTE — Telephone Encounter (Signed)
Spoke with pt dtr, aware monitor shows atrial fib, rate mildly increased. Per dr Jens Som pt to increase metoprolol to 25 mg once daily. Script changed at [facility].

## 2012-10-07 ENCOUNTER — Telehealth: Payer: Self-pay | Admitting: Cardiology

## 2012-10-07 NOTE — Telephone Encounter (Signed)
New Problem   Pt was put on metoprolol and eliquis and now she is having headaches in her kneck and head that is getting servere.. Daughter wants to know if pt should restrain from taking medication for a while

## 2012-10-07 NOTE — Telephone Encounter (Signed)
Spoke with pt dtr, they will stop the metoprolol to see if the headaches improve. If not we will try stopping the eliquis. dtr agreed with this plan.

## 2012-10-09 ENCOUNTER — Other Ambulatory Visit (INDEPENDENT_AMBULATORY_CARE_PROVIDER_SITE_OTHER): Payer: Medicare Other

## 2012-10-09 DIAGNOSIS — I10 Essential (primary) hypertension: Secondary | ICD-10-CM

## 2012-10-09 DIAGNOSIS — I2510 Atherosclerotic heart disease of native coronary artery without angina pectoris: Secondary | ICD-10-CM

## 2012-10-09 DIAGNOSIS — I4891 Unspecified atrial fibrillation: Secondary | ICD-10-CM

## 2012-10-09 LAB — CBC WITH DIFFERENTIAL/PLATELET
Basophils Relative: 0.8 % (ref 0.0–3.0)
Eosinophils Relative: 1.6 % (ref 0.0–5.0)
Lymphocytes Relative: 30.4 % (ref 12.0–46.0)
MCV: 92 fl (ref 78.0–100.0)
Monocytes Absolute: 0.6 10*3/uL (ref 0.1–1.0)
Neutrophils Relative %: 57.4 % (ref 43.0–77.0)
Platelets: 242 10*3/uL (ref 150.0–400.0)
RBC: 4.1 Mil/uL (ref 3.87–5.11)
WBC: 5.8 10*3/uL (ref 4.5–10.5)

## 2012-10-09 LAB — BASIC METABOLIC PANEL
BUN: 18 mg/dL (ref 6–23)
Calcium: 9.3 mg/dL (ref 8.4–10.5)
Chloride: 101 mEq/L (ref 96–112)
Creatinine, Ser: 1 mg/dL (ref 0.4–1.2)
GFR: 56.99 mL/min — ABNORMAL LOW (ref >60.00)

## 2012-11-10 ENCOUNTER — Encounter: Payer: Self-pay | Admitting: Cardiology

## 2012-11-13 ENCOUNTER — Encounter: Payer: Self-pay | Admitting: Cardiology

## 2012-11-13 ENCOUNTER — Ambulatory Visit (INDEPENDENT_AMBULATORY_CARE_PROVIDER_SITE_OTHER): Payer: Medicare Other | Admitting: Cardiology

## 2012-11-13 VITALS — BP 128/72 | HR 85 | Ht 61.0 in | Wt 119.0 lb

## 2012-11-13 DIAGNOSIS — I4891 Unspecified atrial fibrillation: Secondary | ICD-10-CM

## 2012-11-13 DIAGNOSIS — I10 Essential (primary) hypertension: Secondary | ICD-10-CM

## 2012-11-13 DIAGNOSIS — I2510 Atherosclerotic heart disease of native coronary artery without angina pectoris: Secondary | ICD-10-CM

## 2012-11-13 MED ORDER — METOPROLOL SUCCINATE ER 25 MG PO TB24: 25.0000 mg | ORAL_TABLET | Freq: Every day | ORAL | Status: DC

## 2012-11-13 NOTE — Patient Instructions (Signed)
Your physician wants you to follow-up in: 6 MONTHS WITH DR Jens Som You will receive a reminder letter in the mail two months in advance. If you don't receive a letter, please call our office to schedule the follow-up appointment.   INCREASE METOPROLOL TO ONE TABLET ONCE DAILY

## 2012-11-13 NOTE — Assessment & Plan Note (Signed)
Continue present blood pressure medications. 

## 2012-11-13 NOTE — Progress Notes (Signed)
HPI: Pleasant female for fu of atrial fibrillation and reported CAD. [place] There is a reported hx of myocardial infarction in 1989 treated medically. Echo 03/2012: Mild LVH, EF 55-60%, grade 2 diastolic dysfunction, mild AI, mild MR, moderate LAE, PASP 36. When I last saw her in August of 2014 she was in back in atrial fibrillation. We decided to continue with rate control and anticoagulation. Holter monitor in September of 2014 showed atrial fibrillation with mildly elevated rate. Her Toprol was increased. Since that time, he does have fatigue and dyspnea on exertion but this is long-standing and preceded her recurrent atrial fibrillation. No exertional chest pain, syncope or bleeding.   Current Outpatient Prescriptions  Medication Sig Dispense Refill  . amLODipine (NORVASC) 10 MG tablet Take 10 mg by mouth daily.      Marland Kitchen apixaban (ELIQUIS) 2.5 MG TABS tablet Take 1 tablet (2.5 mg total) by mouth 2 (two) times daily.  60 tablet  12  . isosorbide mononitrate (IMDUR) 30 MG 24 hr tablet Take one tablet by mouth every morning and one half tablet by mouth every evening      . magnesium oxide (MAG-OX) 400 MG tablet Take 400 mg by mouth 2 (two) times daily.      . metoprolol succinate (TOPROL XL) 25 MG 24 hr tablet Take 1 tablet (25 mg total) by mouth daily.  30 tablet  11  . nitroGLYCERIN (NITROSTAT) 0.4 MG SL tablet Place 1 tablet (0.4 mg total) under the tongue every 5 (five) minutes as needed for chest pain.  25 tablet  3  . omeprazole (PRILOSEC) 20 MG capsule Take 20 mg by mouth daily.       No current facility-administered medications for this visit.     Past Medical History  Diagnosis Date  . Hypertension   . CAD (coronary artery disease)     a. s/p MI 1989 which was thought to be mild, did not require any cardiac catheterization or angioplasty      . Mitral valve prolapse     a. Echo 03/2012: Mild LVH, EF 55-60%, grade 2 diastolic dysfunction, mild AI,  mild MR, moderate LAE, PASP 36  . Osteoporosis   . Compression fracture of vertebral column   . Atrial fibrillation     on sotalol, rate controlled  . Hyponatremia   . Chronic back pain     History reviewed. No pertinent past surgical history.  History   Social History  . Marital Status: Widowed    Spouse Name: N/A    Number of Children: N/A  . Years of Education: N/A   Occupational History  . Not on file.   Social History Main Topics  . Smoking status: Never Smoker   . Smokeless tobacco: Never Used  . Alcohol Use: No  . Drug Use: No  . Sexual Activity: Not Currently   Other Topics Concern  . Not on file   Social History Narrative   PAST MEDICAL HISTORY:  Positive for history AFib in the past, but not on anticoagulation, she is just on aspirin.  History of hypertension.  She had an MI in 51 which was thought to be mild, did not require any cardiac catheterization or angioplasty, history of mitral valve prolapse, history of hypertension.  Denies any surgeries at all whatsoever.             SOCIAL HISTORY:  She lives in [place].  She lives by herself.  She usually is very independent.  She cleans and cooks herself.  No smoking, alcohol, or illicit drug use.  There is no living will according to the daughter.             FAMILY HISTORY:  Positive for heart disease.    ROS: no fevers or chills, productive cough, hemoptysis, dysphasia, odynophagia, melena, hematochezia, dysuria, hematuria, rash, seizure activity, orthopnea, PND, pedal edema, claudication. Remaining systems are negative.  Physical Exam: Well-developed frail in no acute distress.  Skin is warm and dry.  HEENT is normal.  Neck is supple.  Chest is clear to auscultation with normal expansion.  Cardiovascular exam is irregular Abdominal exam nontender or distended. No masses palpated. Extremities show trace edema. neuro grossly intact  ECG atrial fibrillation at a rate of 85. No ST changes.

## 2012-11-13 NOTE — Assessment & Plan Note (Signed)
Patient remains in atrial fibrillation. She does have fatigue and dyspnea but these symptoms are long-standing and preceded her recurrent atrial fibrillation. I think rate control and anticoagulation is the best option. Increase Toprol to 25 mg daily. Continue apixaban.

## 2012-11-13 NOTE — Assessment & Plan Note (Addendum)
Conservative measures given age. Not on aspirin given need for anticoagulation.

## 2012-12-11 ENCOUNTER — Other Ambulatory Visit: Payer: Self-pay

## 2012-12-21 ENCOUNTER — Emergency Department (HOSPITAL_COMMUNITY): Payer: Medicare Other

## 2012-12-21 ENCOUNTER — Emergency Department (HOSPITAL_COMMUNITY)
Admission: EM | Admit: 2012-12-21 | Discharge: 2012-12-21 | Disposition: A | Discharge status: Home / Self Care | Payer: Medicare Other | Attending: Emergency Medicine | Admitting: Emergency Medicine

## 2012-12-21 ENCOUNTER — Encounter (HOSPITAL_COMMUNITY): Payer: Self-pay | Admitting: Emergency Medicine

## 2012-12-21 DIAGNOSIS — Y939 Activity, unspecified: Secondary | ICD-10-CM | POA: Insufficient documentation

## 2012-12-21 DIAGNOSIS — I4891 Unspecified atrial fibrillation: Secondary | ICD-10-CM | POA: Insufficient documentation

## 2012-12-21 DIAGNOSIS — I10 Essential (primary) hypertension: Secondary | ICD-10-CM | POA: Insufficient documentation

## 2012-12-21 DIAGNOSIS — I059 Rheumatic mitral valve disease, unspecified: Secondary | ICD-10-CM | POA: Insufficient documentation

## 2012-12-21 DIAGNOSIS — M545 Low back pain, unspecified: Secondary | ICD-10-CM | POA: Insufficient documentation

## 2012-12-21 DIAGNOSIS — I2510 Atherosclerotic heart disease of native coronary artery without angina pectoris: Secondary | ICD-10-CM | POA: Insufficient documentation

## 2012-12-21 DIAGNOSIS — Y929 Unspecified place or not applicable: Secondary | ICD-10-CM | POA: Insufficient documentation

## 2012-12-21 DIAGNOSIS — IMO0002 Reserved for concepts with insufficient information to code with codable children: Secondary | ICD-10-CM | POA: Insufficient documentation

## 2012-12-21 DIAGNOSIS — G8929 Other chronic pain: Secondary | ICD-10-CM | POA: Insufficient documentation

## 2012-12-21 DIAGNOSIS — X58XXXA Exposure to other specified factors, initial encounter: Secondary | ICD-10-CM | POA: Insufficient documentation

## 2012-12-21 MED ORDER — TRAMADOL HCL 50 MG PO TABS: 25.0000 mg | ORAL_TABLET | Freq: Four times a day (QID) | ORAL | Status: DC | PRN

## 2012-12-21 MED ORDER — TRAMADOL HCL 50 MG PO TABS
50.0000 mg | ORAL_TABLET | Freq: Once | ORAL | Status: AC
  Administered 2012-12-21: 50 mg via ORAL
  Filled 2012-12-21: qty 1

## 2012-12-21 NOTE — ED Notes (Signed)
Pt reports lower back pain. Reports hx of fractures in the past. States that she has been unable to sleep well at night because of the pain. No falls or injury noted. Skin warm and dry. Sensation intact. Denies any urinary symptoms.

## 2012-12-21 NOTE — ED Notes (Signed)
Pt hx of 6 previous fractures, pain 2/10, pt took 2 tylenol before arrival. Pt in NAD.

## 2012-12-21 NOTE — ED Provider Notes (Signed)
CSN: [id number]     Arrival date & time 12/21/12  1141 History   First MD Initiated Contact with Patient 12/21/12 1146     Chief Complaint  Patient presents with  . Back Pain   (Consider location/radiation/quality/duration/timing/severity/associated sxs/prior Treatment) The history is provided by the patient.  [name] is a 79 y.o. female history of hypertension, CAD, compression fractures of the spine, A. fib on Eliquis here with lower back pain.  Lower back pain for weeks but worse yesterday and she wasn't able to sleep. Denies any falls or recent injury. States that she had compression fractures in the past but is not taking any pain medicine currently. Denies numbness or weakness. No incontinence. No abdominal pain or vomiting.    Past Medical History  Diagnosis Date  . Hypertension   . CAD (coronary artery disease)     a. s/p MI 1989 which was thought to be mild, did not require any cardiac catheterization or angioplasty      . Mitral valve prolapse     a. Echo 03/2012: Mild LVH, EF 55-60%, grade 2 diastolic dysfunction, mild AI, mild MR, moderate LAE, PASP 36  . Osteoporosis   . Compression fracture of vertebral column   . Atrial fibrillation     on sotalol, rate controlled  . Hyponatremia   . Chronic back pain    History reviewed. No pertinent past surgical history. Family History  Problem Relation Age of Onset  . Heart disease      Family History : Positive for   History  Substance Use Topics  . Smoking status: Never Smoker   . Smokeless tobacco: Never Used  . Alcohol Use: No   OB History   Grav Para Term Preterm Abortions TAB SAB Ect Mult Living                 Review of Systems  Musculoskeletal: Positive for back pain.  All other systems reviewed and are negative.    Allergies  Atenolol; Ciprofloxacin; Doxycycline; Escitalopram oxalate; Moexipril; Pantoprazole sodium; and Propranolol  Home Medications   Current Outpatient Rx  Name  Route  Sig   Dispense  Refill  . amLODipine (NORVASC) 10 MG tablet   Oral   Take 10 mg by mouth daily.         Marland Kitchen apixaban (ELIQUIS) 2.5 MG TABS tablet   Oral   Take 1 tablet (2.5 mg total) by mouth 2 (two) times daily.   60 tablet   12   . isosorbide mononitrate (IMDUR) 30 MG 24 hr tablet   Oral   Take 15-30 mg by mouth 2 (two) times daily. Take one tablet by mouth every morning and one half tablet by mouth every evening         . magnesium oxide (MAG-OX) 400 MG tablet   Oral   Take 400 mg by mouth 2 (two) times daily.         . metoprolol succinate (TOPROL XL) 25 MG 24 hr tablet   Oral   Take 1 tablet (25 mg total) by mouth daily.   30 tablet   11   . nitroGLYCERIN (NITROSTAT) 0.4 MG SL tablet   Sublingual   Place 1 tablet (0.4 mg total) under the tongue every 5 (five) minutes as needed for chest pain.   25 tablet   3   . omeprazole (PRILOSEC) 20 MG capsule   Oral   Take 20 mg by mouth daily.  BP 131/67  Pulse 77  Temp(Src) 97.7 F (36.5 C) (Oral)  Resp 16  SpO2 95% Physical Exam  Nursing note and vitals reviewed. Constitutional: She is oriented to person, place, and time. She appears well-nourished.  Chronically ill, slightly uncomfortable   HENT:  Head: Normocephalic.  Mouth/Throat: Oropharynx is clear and moist.  Eyes: Conjunctivae are normal. Pupils are equal, round, and reactive to light.  Neck: Normal range of motion. Neck supple.  Cardiovascular: Normal rate, regular rhythm and normal heart sounds.   Pulmonary/Chest: Effort normal and breath sounds normal. No respiratory distress. She has no wheezes. She has no rales.  Abdominal: Soft. Bowel sounds are normal. She exhibits no distension. There is no tenderness. There is no rebound.  Musculoskeletal: Normal range of motion.  No midline tenderness. Mild L lower lumbar tenderness. Nl ROM bilateral hips   Neurological: She is alert and oriented to person, place, and time.  Neg straight leg raise. No  saddle anesthesia.   Skin: Skin is warm and dry.  Psychiatric: She has a normal mood and affect. Her behavior is normal. Judgment and thought content normal.    ED Course  Procedures (including critical care time) Labs Review Labs Reviewed - No data to display Imaging Review Dg Lumbar Spine Complete  12/21/2012   CLINICAL DATA:  Back pain  EXAM: LUMBAR SPINE - COMPLETE 4+ VIEW  COMPARISON:  None.  FINDINGS: There are 5 nonrib bearing lumbar-type vertebral bodies. There is severe osteopenia. There are chronic vertebral body compression fractures involving the T10, T11, L1,, L4 and L5 vertebral bodies. The alignment is anatomic. There is no spondylolysis. There is no acute fracture or static listhesis. There is severe degenerative disc disease at L5-S1 with bilateral facet arthropathy.  The SI joints are unremarkable.  There is abdominal aortic atherosclerosis.  IMPRESSION: Chronic multilevel vertebral body compression fractures. No acute osseous injury of the lumbar spine. Given the degree of osteopenia, if there is clinical concern regarding an occult fracture, further evaluation with MRI is recommended for increased sensitivity.   Electronically Signed   By: Elige Ko   On: 12/21/2012 12:37    EKG Interpretation   None       MDM  No diagnosis found. [name] is a 79 y.o. female here with back pain, likely from compression fractures. Will get xray to r/o new fracture. Will give pain meds and reassess. No signs of neurovascular compromise and I doubt AAA.    1:49 PM Xray showed old fractures. Felt better with low dose tramadol. Will d/c home with tylenol and tramadol prn.    Richardean Canal, MD 12/21/12 1350

## 2013-01-14 IMAGING — CR DG CHEST 2V
2 series · 2 of 2 positions shown · non-contrast
Comparison: None.

CLINICAL DATA: Cough for several days.

CHEST - 2 VIEW

[view not recorded (1 of 2)]
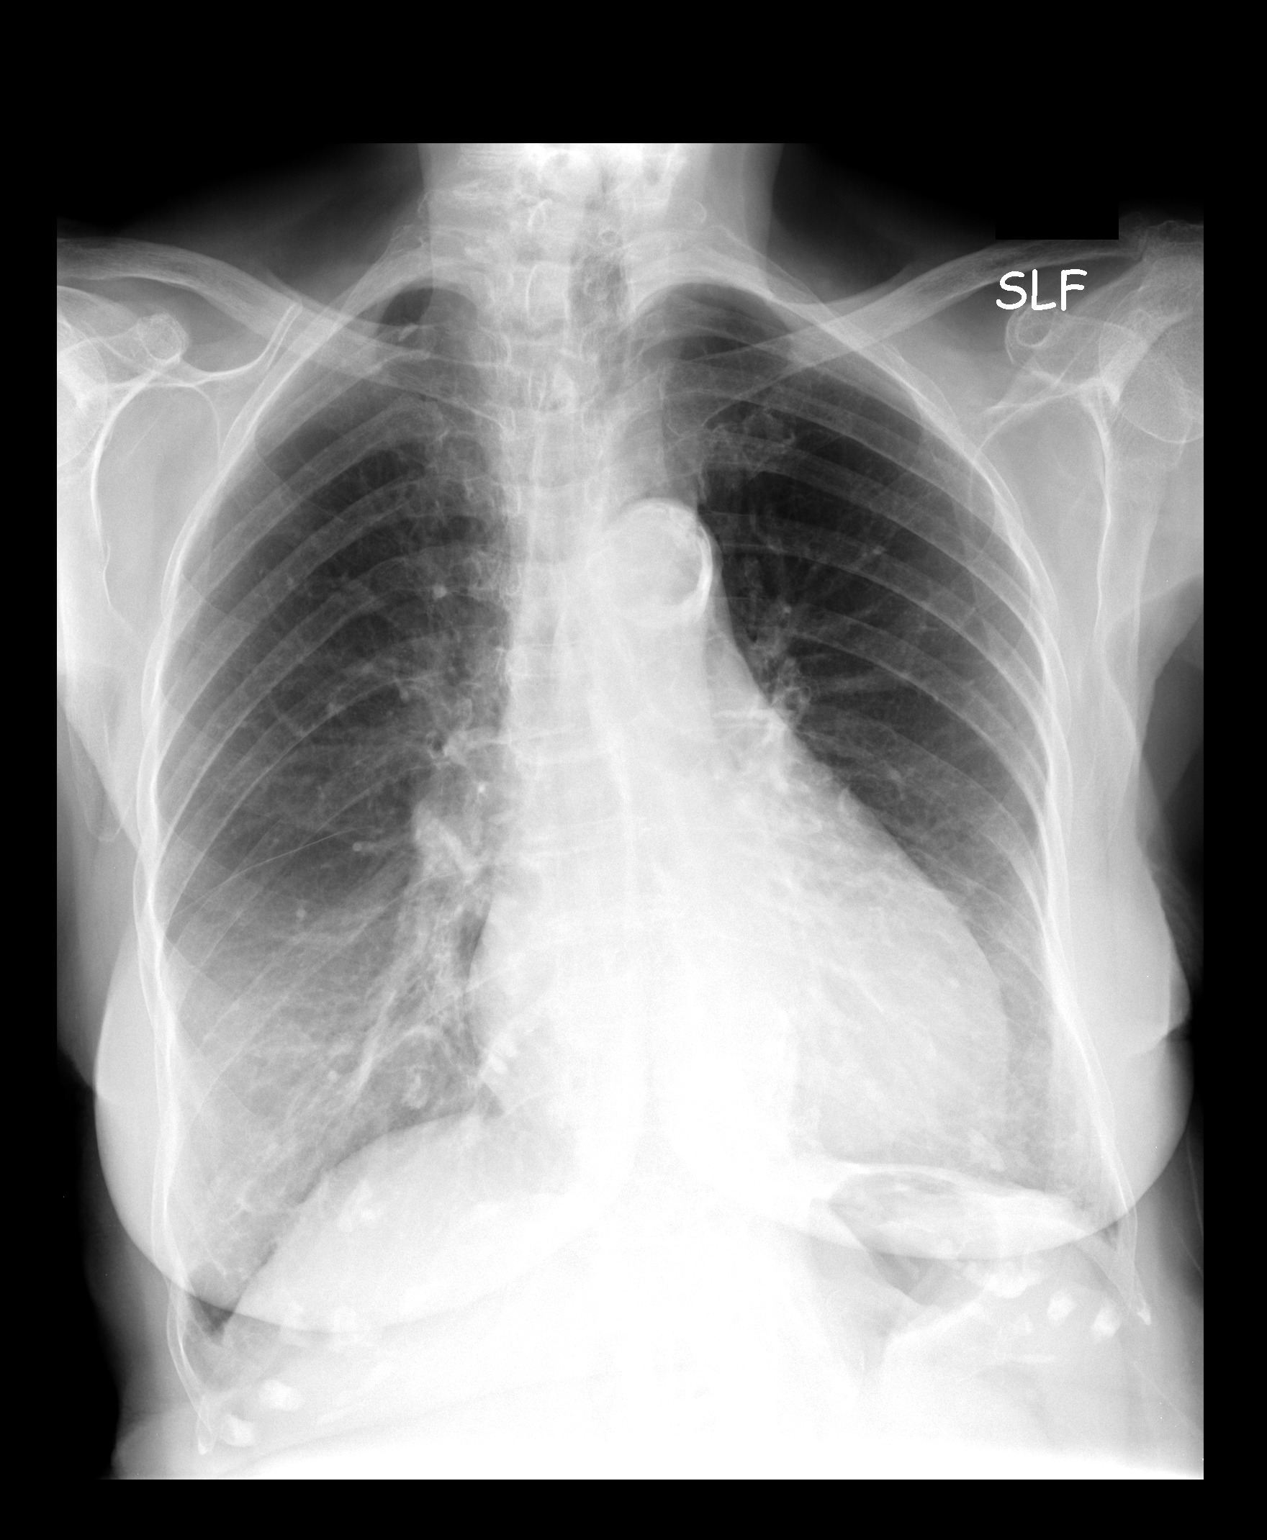

[view not recorded (2 of 2)]
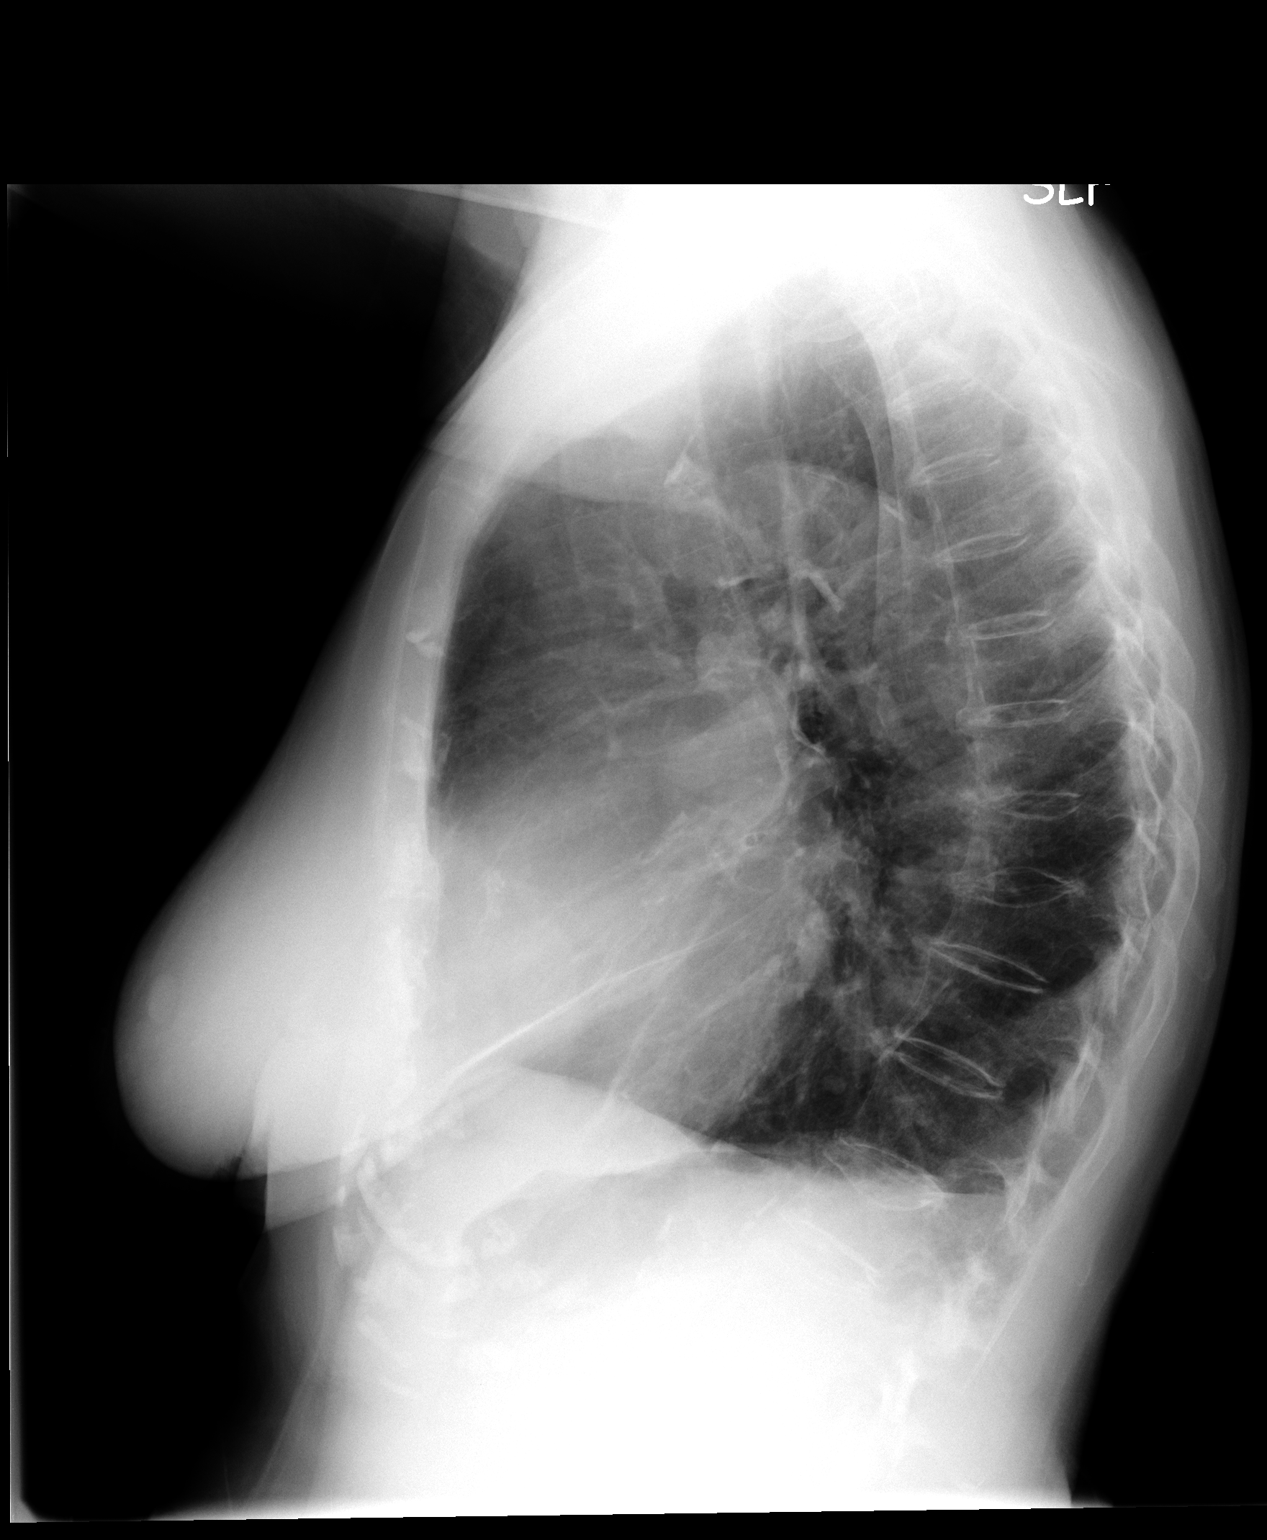

[2 of 2 positions shown; findings below may reference images not displayed]

FINDINGS: The lungs are hyperexpanded, with flattening of the
hemidiaphragms, likely reflecting COPD.  Likely chronic
peribronchial thickening is noted.  Mild bibasilar opacities may
reflect atelectasis or scarring.  There is no evidence of pleural
effusion or pneumothorax.

The heart is mildly enlarged; diffuse calcification is noted in the
aortic arch.  No acute osseous abnormalities are seen.  There are
likely chronic compression deformities at the lower thoracic and
upper lumbar spine.
IMPRESSION: 1.  Findings suggestive of COPD; mild bibasilar opacities may
reflect atelectasis or scarring.
2.  Mild cardiomegaly noted.
3.  Likely chronic compression deformities at the lower thoracic
and upper lumbar spine.

## 2013-01-23 IMAGING — CT CT CTA ABD/PEL W/CM AND/OR W/O CM
2 of 7 series · 14 of 46 positions shown, 18 images · IV contrast (CONTRAST)
Comparison: None

CLINICAL DATA: [AGE] female with abdominal, pelvic, and mid
back pain.

CT ANGIOGRAPHY ABDOMEN AND PELVIS
TECHNIQUE: Multidetector CT imaging of the abdomen and pelvis was
performed using the standard protocol during bolus administration
of intravenous contrast.  Multiplanar reconstructed images
including MIPs were obtained and reviewed to evaluate the vascular
anatomy.
Contrast: 80mL OMNIPAQUE IOHEXOL 350 MG/ML IV SOLN

[Series 4: soft tissue · axial · 0.68mm/px · z∈[-364,-24]mm · 11 of 196 slices shown, 15 images]
[im 17/196  soft-tissue]
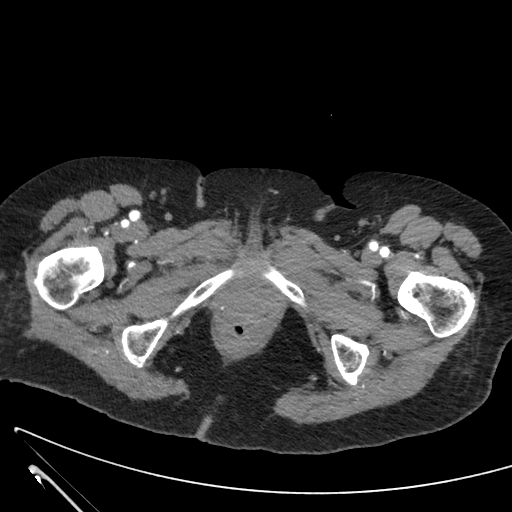
[im 17/196  bone]
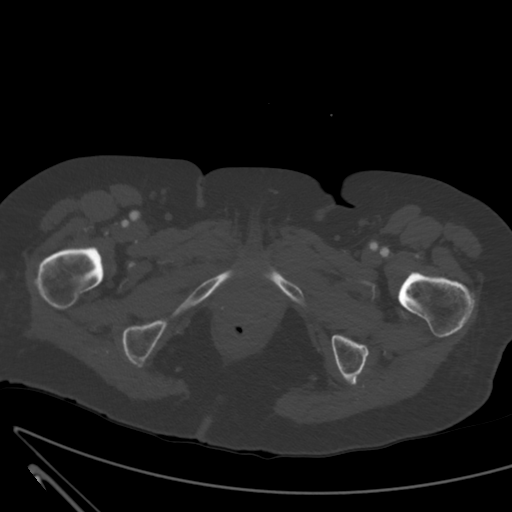
[im 34/196  soft-tissue]
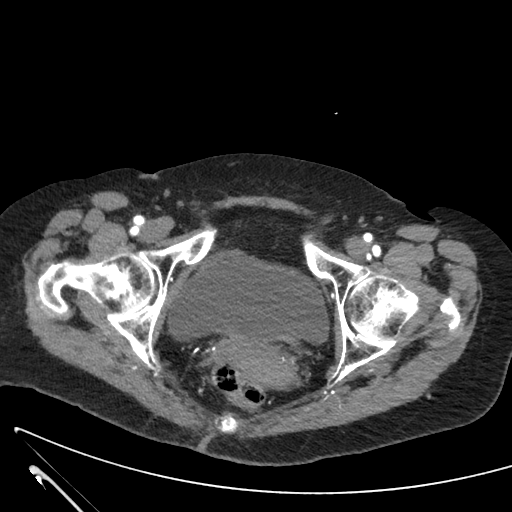
[im 60/196  soft-tissue]
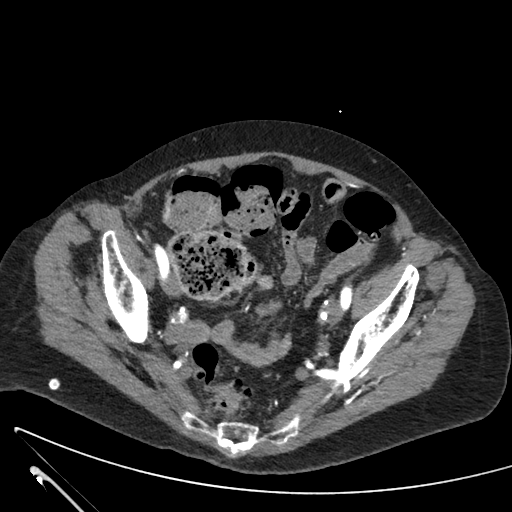
[im 77/196  soft-tissue]
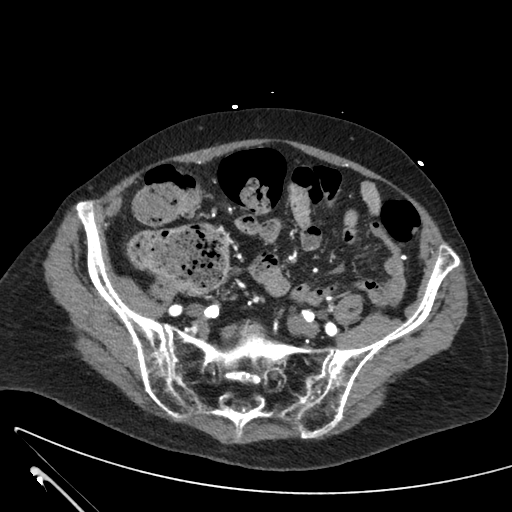
[im 102/196  soft-tissue]
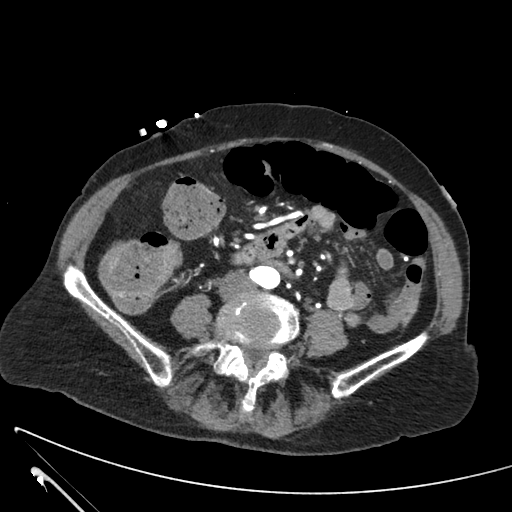
[im 119/196  soft-tissue]
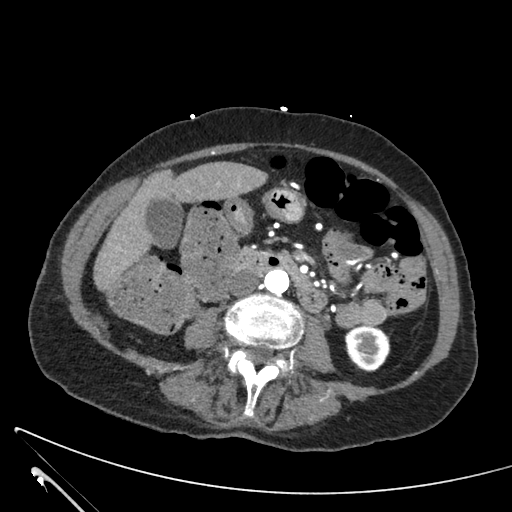
[im 136/196  soft-tissue]
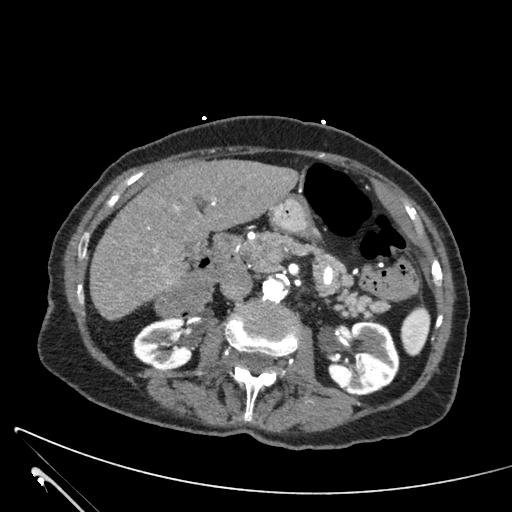
[im 162/196  soft-tissue]
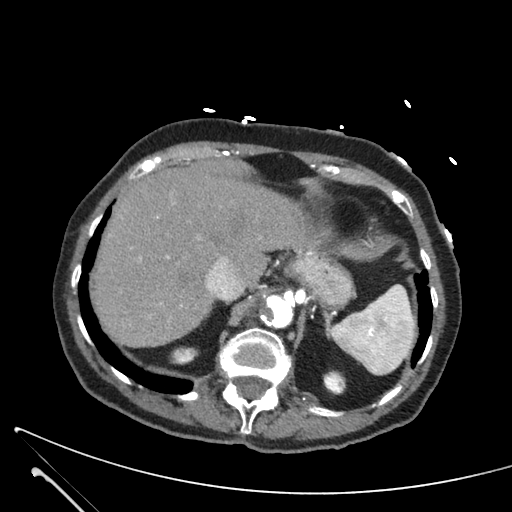
[im 162/196  lung]
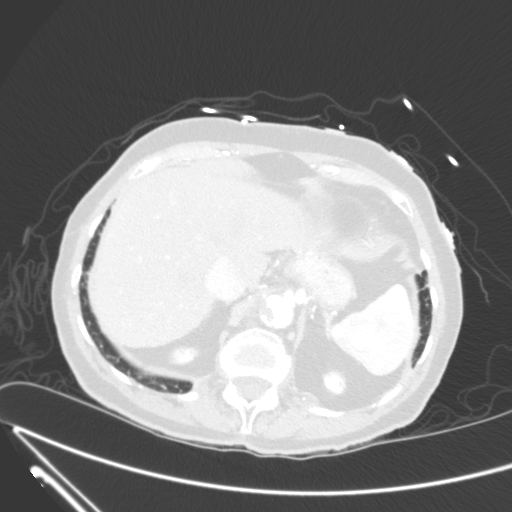
[im 170/196  lung]
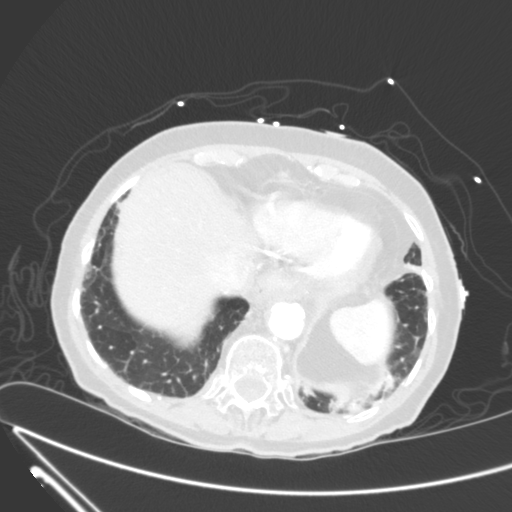
[im 179/196  soft-tissue]
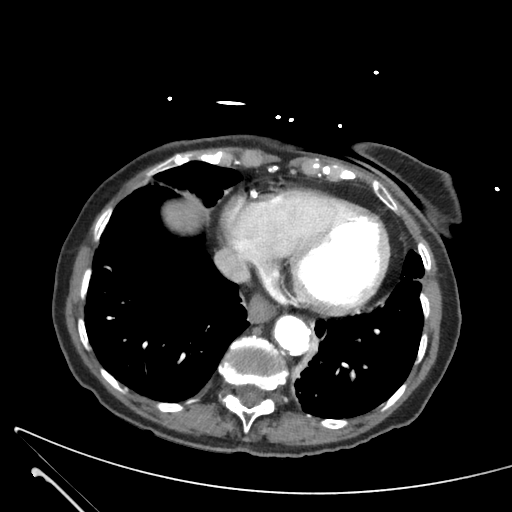
[im 179/196  lung]
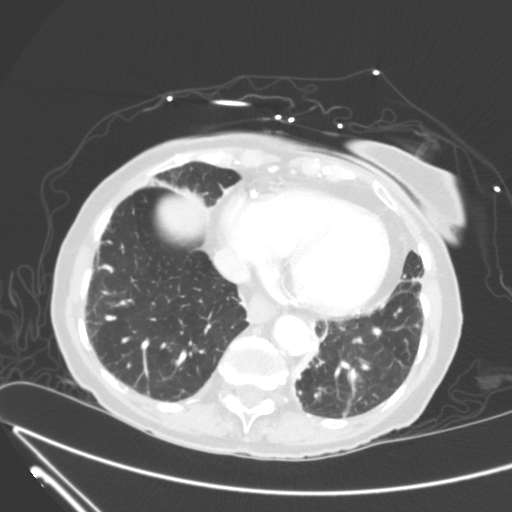
[im 179/196  bone]
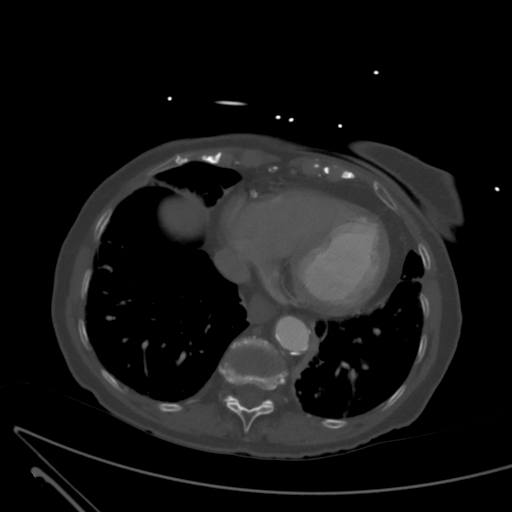
[im 187/196  lung]
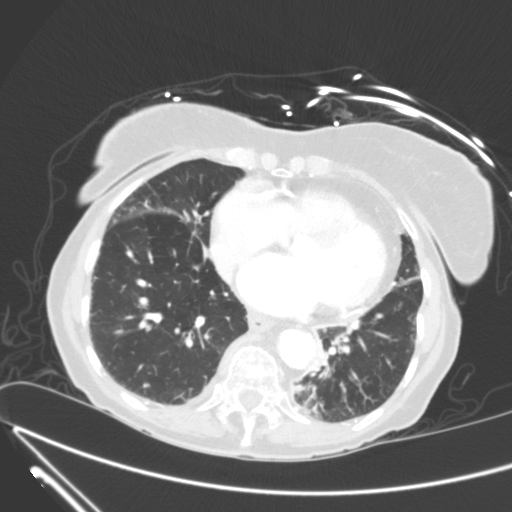

[mpr, coronal, coronal · coronal · 0.76mm/px · 3 of 87 slices shown]
[im 22/87  soft-tissue]
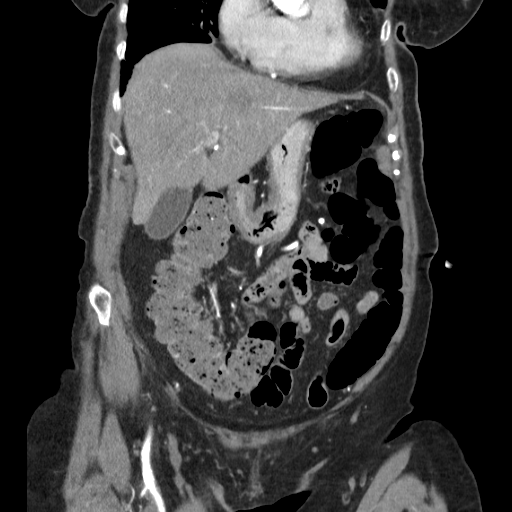
[im 44/87  soft-tissue]
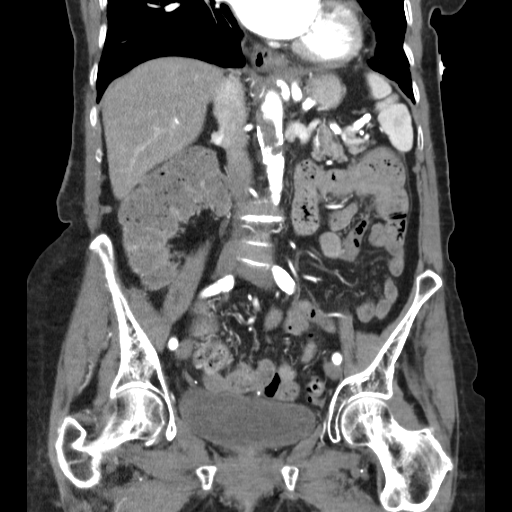
[im 65/87  soft-tissue]
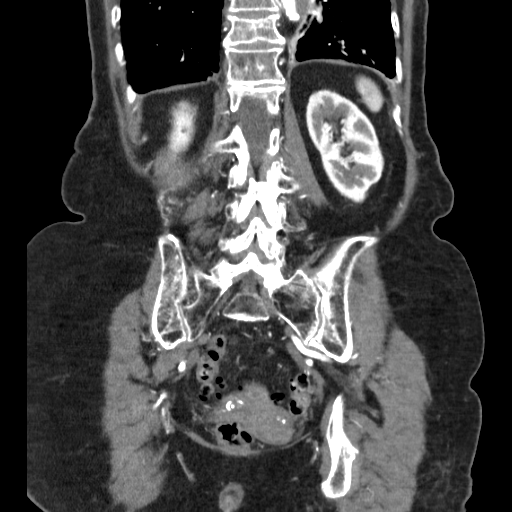

[14 of 46 positions shown; findings below may reference images not displayed]

FINDINGS: Moderate to large noncalcified plaque versus chronic
thrombosed dissection is identified within the proximal abdominal
aorta extending to the mid abdominal aorta below renal artery
origins.  This noncalcified plaque/chronic thrombosed dissection
narrows the aortic lumen.
1 cm below this plaque/thrombosed dissection is a large chronic
irregular calcified plaque with narrowing of the aortic lumen as
well.
There is no evidence of abdominal aortic aneurysm or acute
dissection.
The visualized mesenteric vessels are patent.
There is no evidence of iliac artery stenosis, aneurysm or
dissection.

Cardiomegaly is identified.

The liver, spleen, adrenal glands, pancreas and gallbladder
unremarkable.
Bilateral renal cortical atrophy is identified.

There is no evidence of free fluid, enlarged lymph nodes or biliary
dilatation.

Compression fractures of T11, L1, L4 and L5 are identified.  The L4
superior endplate compression fracture is 50% with 3 mm bony
retropulsion and may be acute to subacute.  The other compression
fractures appear more remote.

 Review of the MIP images confirms the above findings.
IMPRESSION: Moderate to large noncalcified plaque versus chronic thrombosed
dissection within the proximal - mid abdominal aorta narrowing the
lumen.  No evidence of acute dissection or aneurysm.

Large irregular calcified plaque within the mid abdominal aorta
narrowing the lumen.

T11, L1, L4 and L5 compression fractures.  The L4 compression
fracture has 3 mm bony retropulsion and may be acute to subacute.
The other compression fractures appear remote.  Correlate
clinically.

Bilateral renal cortical atrophy.

## 2013-07-09 ENCOUNTER — Ambulatory Visit (INDEPENDENT_AMBULATORY_CARE_PROVIDER_SITE_OTHER): Payer: Medicare Other | Admitting: Cardiology

## 2013-07-09 ENCOUNTER — Encounter: Payer: Self-pay | Admitting: Cardiology

## 2013-07-09 VITALS — BP 104/62 | HR 90 | Ht 61.0 in | Wt 116.8 lb

## 2013-07-09 DIAGNOSIS — I10 Essential (primary) hypertension: Secondary | ICD-10-CM

## 2013-07-09 DIAGNOSIS — I4891 Unspecified atrial fibrillation: Secondary | ICD-10-CM

## 2013-07-09 DIAGNOSIS — I2510 Atherosclerotic heart disease of native coronary artery without angina pectoris: Secondary | ICD-10-CM

## 2013-07-09 LAB — CBC WITH DIFFERENTIAL/PLATELET
BASOS ABS: 0 10*3/uL (ref 0.0–0.1)
Basophils Relative: 0.3 % (ref 0.0–3.0)
Eosinophils Absolute: 0.1 10*3/uL (ref 0.0–0.7)
Eosinophils Relative: 1.9 % (ref 0.0–5.0)
HEMATOCRIT: 41.1 % (ref 36.0–46.0)
Hemoglobin: 13.7 g/dL (ref 12.0–15.0)
LYMPHS ABS: 1.9 10*3/uL (ref 0.7–4.0)
Lymphocytes Relative: 28.1 % (ref 12.0–46.0)
MCHC: 33.4 g/dL (ref 30.0–36.0)
MCV: 91.1 fl (ref 78.0–100.0)
Monocytes Absolute: 0.4 10*3/uL (ref 0.1–1.0)
Monocytes Relative: 6.2 % (ref 3.0–12.0)
Neutro Abs: 4.3 10*3/uL (ref 1.4–7.7)
Neutrophils Relative %: 63.5 % (ref 43.0–77.0)
Platelets: 231 10*3/uL (ref 150.0–400.0)
RBC: 4.51 Mil/uL (ref 3.87–5.11)
RDW: 14.2 % (ref 11.5–15.5)
WBC: 6.8 10*3/uL (ref 4.0–10.5)

## 2013-07-09 LAB — BASIC METABOLIC PANEL
BUN: 24 mg/dL — AB (ref 6–23)
CALCIUM: 9.6 mg/dL (ref 8.4–10.5)
CO2: 30 mEq/L (ref 19–32)
Chloride: 102 mEq/L (ref 96–112)
Creatinine, Ser: 1.1 mg/dL (ref 0.4–1.2)
GFR: 50.27 mL/min — ABNORMAL LOW (ref >60.00)
Glucose, Bld: 98 mg/dL (ref 70–99)
Potassium: 4.2 mEq/L (ref 3.5–5.1)
Sodium: 138 mEq/L (ref 135–145)

## 2013-07-09 NOTE — Assessment & Plan Note (Signed)
Patient remains in atrial fibrillation. She does have fatigue and dyspnea but these symptoms are long-standing and preceded her recurrent atrial fibrillation. I think rate control and anticoagulation is the best option. Continue Toprol. Continue apixaban. Check hemoglobin and renal function.

## 2013-07-09 NOTE — Patient Instructions (Signed)
Your physician wants you to follow-up in: 6 MONTHS WITH DR CRENSHAW You will receive a reminder letter in the mail two months in advance. If you don't receive a letter, please call our office to schedule the follow-up appointment.   Your physician recommends that you HAVE LAB WORK TODAY 

## 2013-07-09 NOTE — Assessment & Plan Note (Signed)
Plan conservative measures given age.

## 2013-07-09 NOTE — Progress Notes (Signed)
HPI: FU atrial fibrillation and reported CAD[place] There is a reported hx of myocardial infarction in 1989 treated medically. Echo 03/2012: Mild LVH, EF 55-60%, grade 2 diastolic dysfunction, mild AI, mild MR, moderate LAE, PASP 36. Atrial fibrillation now treated with rate control and anticoagulation. Holter monitor in September of 2014 showed atrial fibrillation with mildly elevated rate. Her Toprol was increased. Since I last saw her, She has dyspnea on exertion and weakness which is chronic. No orthopnea, PND, chest pain or syncope. Occasional mild pedal edema.    Current Outpatient Prescriptions  Medication Sig Dispense Refill  . amLODipine (NORVASC) 10 MG tablet Take 10 mg by mouth daily.      Marland Kitchen. apixaban (ELIQUIS) 2.5 MG TABS tablet Take 1 tablet (2.5 mg total) by mouth 2 (two) times daily.  60 tablet  12  . isosorbide mononitrate (IMDUR) 30 MG 24 hr tablet Take 15-30 mg by mouth 2 (two) times daily. Take one tablet by mouth every morning and one half tablet by mouth every evening      . magnesium oxide (MAG-OX) 400 MG tablet Take 400 mg by mouth 2 (two) times daily.      . metoprolol succinate (TOPROL XL) 25 MG 24 hr tablet Take 1 tablet (25 mg total) by mouth daily.  30 tablet  11  . nitroGLYCERIN (NITROSTAT) 0.4 MG SL tablet Place 1 tablet (0.4 mg total) under the tongue every 5 (five) minutes as needed for chest pain.  25 tablet  3  . omeprazole (PRILOSEC) 20 MG capsule Take 20 mg by mouth daily.       No current facility-administered medications for this visit.     Past Medical History  Diagnosis Date  . Hypertension   . CAD (coronary artery disease)     a. s/p MI 1989 which was thought to be mild, did not require any cardiac catheterization or angioplasty      . Mitral valve prolapse     a. Echo 03/2012: Mild LVH, EF 55-60%, grade 2 diastolic dysfunction, mild AI, mild MR, moderate LAE, PASP 36  . Osteoporosis   . Compression fracture of  vertebral column   . Atrial fibrillation     on sotalol, rate controlled  . Hyponatremia   . Chronic back pain     History reviewed. No pertinent past surgical history.  History   Social History  . Marital Status: Widowed    Spouse Name: N/A    Number of Children: N/A  . Years of Education: N/A   Occupational History  . Not on file.   Social History Main Topics  . Smoking status: Never Smoker   . Smokeless tobacco: Never Used  . Alcohol Use: No  . Drug Use: No  . Sexual Activity: Not Currently   Other Topics Concern  . Not on file   Social History Narrative   PAST MEDICAL HISTORY:  Positive for history AFib in the past, but not on anticoagulation, she is just on aspirin.  History of hypertension.  She had an MI in 7289 which was thought to be mild, did not require any cardiac catheterization or angioplasty, history of mitral valve prolapse, history of hypertension.  Denies any surgeries at all whatsoever.             SOCIAL HISTORY:  [place] lives in [place].  She lives by herself.  She usually is very independent.  She cleans and cooks herself.  No smoking, alcohol, or illicit drug use.  There is no living will according to the daughter.             FAMILY HISTORY:  Positive for heart disease.    ROS: no fevers or chills, productive cough, hemoptysis, dysphasia, odynophagia, melena, hematochezia, dysuria, hematuria, rash, seizure activity, orthopnea, PND, pedal edema, claudication. Remaining systems are negative.  Physical Exam: Well-developed frail in no acute distress.  Skin is warm and dry.  HEENT is normal.  Neck is supple.  Chest is clear to auscultation with normal expansion.  Cardiovascular exam is irregular Abdominal exam nontender or distended. No masses palpated. Extremities show trace edema. neuro grossly intact  ECG Atrial fibrillation at a rate of 90. Nonspecific ST changes.

## 2013-07-09 NOTE — Assessment & Plan Note (Signed)
Blood pressure controlled. Continue present medications. 

## 2013-09-03 ENCOUNTER — Other Ambulatory Visit: Payer: Self-pay | Admitting: *Deleted

## 2013-09-03 MED ORDER — APIXABAN 2.5 MG PO TABS: 2.5000 mg | ORAL_TABLET | Freq: Two times a day (BID) | ORAL | Status: DC

## 2013-09-28 ENCOUNTER — Other Ambulatory Visit: Payer: Self-pay | Admitting: *Deleted

## 2013-09-28 MED ORDER — METOPROLOL SUCCINATE ER 25 MG PO TB24: 25.0000 mg | ORAL_TABLET | Freq: Every day | ORAL | Status: DC

## 2013-12-27 ENCOUNTER — Other Ambulatory Visit: Payer: Self-pay

## 2013-12-27 MED ORDER — APIXABAN 2.5 MG PO TABS: 2.5000 mg | ORAL_TABLET | Freq: Two times a day (BID) | ORAL | Status: DC

## 2013-12-29 ENCOUNTER — Other Ambulatory Visit: Payer: Self-pay

## 2013-12-29 MED ORDER — METOPROLOL SUCCINATE ER 25 MG PO TB24: 25.0000 mg | ORAL_TABLET | Freq: Every day | ORAL | Status: DC

## 2014-03-18 ENCOUNTER — Telehealth: Payer: Self-pay | Admitting: Cardiology

## 2014-03-24 ENCOUNTER — Telehealth: Payer: Self-pay | Admitting: Cardiology

## 2014-03-24 NOTE — Telephone Encounter (Signed)
[name] (daughter) is calling because Mrs. [name] is having some pain in her left arm . Please call    Thanks

## 2014-03-24 NOTE — Telephone Encounter (Signed)
Agree [name]  

## 2014-03-24 NOTE — Telephone Encounter (Signed)
Pt c/o left arm pain for a few days. Daughter thinks may be a pulled muscle r/t her leisure activities, only concern was that it hadn't resolved yet.   Pt denies SOB, dizziness, fatigue, or other pain. She has no other complaints or symptoms. Advised probably not a cardiac issue but that I would route to Dr. Jens Somrenshaw for any advice.

## 2014-03-29 ENCOUNTER — Other Ambulatory Visit: Payer: Self-pay | Admitting: *Deleted

## 2014-03-29 MED ORDER — APIXABAN 2.5 MG PO TABS: 2.5000 mg | ORAL_TABLET | Freq: Two times a day (BID) | ORAL | Status: DC

## 2014-03-29 NOTE — Telephone Encounter (Signed)
Closed encounter °

## 2014-04-01 ENCOUNTER — Other Ambulatory Visit: Payer: Self-pay | Admitting: Cardiology

## 2014-04-01 MED ORDER — APIXABAN 2.5 MG PO TABS: 2.5000 mg | ORAL_TABLET | Freq: Two times a day (BID) | ORAL | Status: DC

## 2014-04-01 NOTE — Telephone Encounter (Signed)
Rx(s) sent to pharmacy electronically. Patient's daughter aware.

## 2014-04-01 NOTE — Telephone Encounter (Signed)
°  1. Which medications need to be refilled? Eliquis  2. Which pharmacy is medication to be sent to?[facility] on [address]  3. Do they need a 30 day or 90 day supply? 30 but she would like a 90 day   4. Would they like a call back once the medication has been sent to the pharmacy? Yes because she is out

## 2014-04-02 ENCOUNTER — Other Ambulatory Visit: Payer: Self-pay

## 2014-05-19 NOTE — Progress Notes (Signed)
HPI: FU atrial fibrillation and reported CAD. [place] There is a reported hx of myocardial infarction in 1989 treated medically. Echo 03/2012: Mild LVH, EF 55-60%, grade 2 diastolic dysfunction, mild AI, mild MR, moderate LAE, PASP 36. Atrial fibrillation now treated with rate control and anticoagulation. Holter monitor in September of 2014 showed atrial fibrillation with mildly elevated rate. Her Toprol was increased. Since I last saw her, the patient has dyspnea with more extreme activities but not with routine activities. It is relieved with rest. It is not associated with chest pain. There is no orthopnea, PND. There is no syncope or palpitations. There is no exertional chest pain. Mild pedal edema   Current Outpatient Prescriptions  Medication Sig Dispense Refill  . amLODipine (NORVASC) 10 MG tablet Take 10 mg by mouth daily.    Marland Kitchen apixaban (ELIQUIS) 2.5 MG TABS tablet Take 1 tablet (2.5 mg total) by mouth 2 (two) times daily. 180 tablet 0  . isosorbide mononitrate (IMDUR) 30 MG 24 hr tablet Take 15-30 mg by mouth 2 (two) times daily. Take one tablet by mouth every morning and one half tablet by mouth every evening    . magnesium oxide (MAG-OX) 400 MG tablet Take 400 mg by mouth 2 (two) times daily.    . metoprolol succinate (TOPROL XL) 25 MG 24 hr tablet Take 1 tablet (25 mg total) by mouth daily. 30 tablet 6  . nitroGLYCERIN (NITROSTAT) 0.4 MG SL tablet Place 1 tablet (0.4 mg total) under the tongue every 5 (five) minutes as needed for chest pain. 25 tablet 3  . omeprazole (PRILOSEC) 20 MG capsule Take 20 mg by mouth daily.     No current facility-administered medications for this visit.     Past Medical History  Diagnosis Date  . Hypertension   . CAD (coronary artery disease)     a. s/p MI 1989 which was thought to be mild, did not require any cardiac catheterization or angioplasty      . Mitral valve prolapse     a. Echo 03/2012: Mild LVH, EF  55-60%, grade 2 diastolic dysfunction, mild AI, mild MR, moderate LAE, PASP 36  . Osteoporosis   . Compression fracture of vertebral column   . Atrial fibrillation     on sotalol, rate controlled  . Hyponatremia   . Chronic back pain     History reviewed. No pertinent past surgical history.  History   Social History  . Marital Status: Widowed    Spouse Name: N/A  . Number of Children: N/A  . Years of Education: N/A   Occupational History  . Not on file.   Social History Main Topics  . Smoking status: Never Smoker   . Smokeless tobacco: Never Used  . Alcohol Use: No  . Drug Use: No  . Sexual Activity: Not Currently   Other Topics Concern  . Not on file   Social History Narrative   PAST MEDICAL HISTORY:  Positive for history AFib in the past, but not on anticoagulation, she is just on aspirin.  History of hypertension.  She had an MI in 22 which was thought to be mild, did not require any cardiac catheterization or angioplasty, history of mitral valve prolapse, history of hypertension.  Denies any surgeries at all whatsoever.             SOCIAL HISTORY:  She lives in [place].  She lives by herself.  She usually is very  independent.  She cleans and cooks herself.  No smoking, alcohol, or illicit drug use.  There is no living will according to the daughter.             FAMILY HISTORY:  Positive for heart disease.    ROS: no fevers or chills, productive cough, hemoptysis, dysphasia, odynophagia, melena, hematochezia, dysuria, hematuria, rash, seizure activity, orthopnea, PND, claudication. Remaining systems are negative.  Physical Exam: Well-developed frail in no acute distress.  Skin is warm and dry.  HEENT is normal.  Neck is supple.  Chest is clear to auscultation with normal expansion.  Cardiovascular exam is irregular Abdominal exam nontender or distended. No masses palpated. Extremities show trace edema. neuro grossly intact  ECG atrial fibrillation at a rate of  80. Low voltage. Septal infarct.

## 2014-05-24 ENCOUNTER — Ambulatory Visit (INDEPENDENT_AMBULATORY_CARE_PROVIDER_SITE_OTHER): Payer: Medicare Other | Admitting: Cardiology

## 2014-05-24 ENCOUNTER — Encounter: Payer: Self-pay | Admitting: Cardiology

## 2014-05-24 VITALS — BP 120/62 | HR 80 | Ht 62.0 in | Wt 124.5 lb

## 2014-05-24 DIAGNOSIS — I2583 Coronary atherosclerosis due to lipid rich plaque: Secondary | ICD-10-CM

## 2014-05-24 DIAGNOSIS — I10 Essential (primary) hypertension: Secondary | ICD-10-CM | POA: Diagnosis not present

## 2014-05-24 DIAGNOSIS — I2510 Atherosclerotic heart disease of native coronary artery without angina pectoris: Secondary | ICD-10-CM

## 2014-05-24 DIAGNOSIS — I4891 Unspecified atrial fibrillation: Secondary | ICD-10-CM

## 2014-05-24 NOTE — Assessment & Plan Note (Signed)
Conservative measures given age. 

## 2014-05-24 NOTE — Assessment & Plan Note (Signed)
Blood pressure controlled. Continue present medications. 

## 2014-05-24 NOTE — Assessment & Plan Note (Signed)
Patient remains in permanent atrial fibrillation. Continue beta blocker for rate control. Continue apixaban. Hemoglobin and renal function monitored by primary care.

## 2014-05-24 NOTE — Patient Instructions (Signed)
Your physician wants you to follow-up in: ONE YEAR WITH DR CRENSHAW You will receive a reminder letter in the mail two months in advance. If you don't receive a letter, please call our office to schedule the follow-up appointment.  

## 2014-07-02 ENCOUNTER — Other Ambulatory Visit: Payer: Self-pay | Admitting: Cardiology

## 2014-07-02 MED ORDER — APIXABAN 2.5 MG PO TABS: 2.5000 mg | ORAL_TABLET | Freq: Two times a day (BID) | ORAL | Status: AC

## 2014-07-02 NOTE — Telephone Encounter (Signed)
Rx(s) sent to pharmacy electronically. EC - Beacher MayJean Miller - notified Patient has NOT missed any doses.

## 2014-07-02 NOTE — Telephone Encounter (Signed)
°  1. Which medications need to be refilled? Eliquis   2. Which pharmacy is medication to be sent to?[facility] [address]   3. Do they need a 30 day or 90 day supply? Not specified   4. Would they like a call back once the medication has been sent to the pharmacy? Yes because she is completely out

## 2014-07-08 ENCOUNTER — Telehealth: Payer: Self-pay | Admitting: Cardiology

## 2014-07-08 MED ORDER — ATENOLOL 25 MG PO TABS: 25.0000 mg | ORAL_TABLET | Freq: Every day | ORAL | Status: AC

## 2014-07-08 NOTE — Telephone Encounter (Signed)
Spoke with pt dtr, Aware of dr Ludwig Clarkscrenshaw's recommendations.  New script sent to the pharmacy

## 2014-07-08 NOTE — Telephone Encounter (Signed)
Spoke with pt dtr, she is aware the metoprolol is helping to control her heart rate, but she feels the metoprolol maybe causing the patient to have more forgetfulness. She would like the metoprolol changed to something else that will not affect her memory. Will forward for dr Jens Somcrenshaw review

## 2014-07-08 NOTE — Telephone Encounter (Signed)
DC metoprolol; atenolol 25 mg daily Olga MillersBrian Clair [place]

## 2014-07-08 NOTE — Telephone Encounter (Signed)
Please call,concerning her Metoprolol. Pt memory is getting worse,one of the side effects of Metoprolol is memory loss.

## 2015-03-09 DEATH — deceased
